# Patient Record
Sex: Male | Born: 1970 | Race: Black or African American | Hispanic: No | Smoking: Never smoker
Health system: Southern US, Community
[De-identification: ages and names within clinical notes are randomized; demographics above are authoritative.]

## PROBLEM LIST (undated history)

## (undated) DIAGNOSIS — I6529 Occlusion and stenosis of unspecified carotid artery: Secondary | ICD-10-CM

## (undated) DIAGNOSIS — I10 Essential (primary) hypertension: Secondary | ICD-10-CM

## (undated) DIAGNOSIS — E785 Hyperlipidemia, unspecified: Secondary | ICD-10-CM

## (undated) DIAGNOSIS — I669 Occlusion and stenosis of unspecified cerebral artery: Secondary | ICD-10-CM

## (undated) DIAGNOSIS — H409 Unspecified glaucoma: Secondary | ICD-10-CM

## (undated) HISTORY — DX: Occlusion and stenosis of unspecified carotid artery: I65.29

## (undated) HISTORY — PX: CARPAL TUNNEL RELEASE: SHX101

## (undated) HISTORY — PX: OTHER SURGICAL HISTORY: SHX169

---

## 2007-06-10 DIAGNOSIS — I669 Occlusion and stenosis of unspecified cerebral artery: Secondary | ICD-10-CM

## 2007-06-10 HISTORY — DX: Occlusion and stenosis of unspecified cerebral artery: I66.9

## 2007-09-08 ENCOUNTER — Inpatient Hospital Stay (HOSPITAL_COMMUNITY): Admission: EM | Admit: 2007-09-08 | Discharge: 2007-09-14 | Payer: Self-pay | Admitting: *Deleted

## 2007-09-08 ENCOUNTER — Encounter (INDEPENDENT_AMBULATORY_CARE_PROVIDER_SITE_OTHER): Payer: Self-pay | Admitting: *Deleted

## 2007-09-08 ENCOUNTER — Ambulatory Visit: Payer: Self-pay | Admitting: Cardiology

## 2007-09-08 ENCOUNTER — Encounter: Payer: Self-pay | Admitting: Emergency Medicine

## 2010-10-22 NOTE — Discharge Summary (Signed)
NAME:  [name], [name]               ACCOUNT NO.:  [id number]   MEDICAL RECORD NO.:  [id number]          PATIENT TYPE:  INP   LOCATION:  3032                         FACILITY:  [facility]   PHYSICIAN:  Pramod P. Pearlean Brownie, MD    DATE OF BIRTH:  1971-06-01   DATE OF ADMISSION:  09/08/2007  DATE OF DISCHARGE:  09/14/2007                               DISCHARGE SUMMARY   DIAGNOSIS:  At the time of discharge; Venous sinus thrombosis, unknown  etiology.   MEDICINES:  At the time of discharge, Coumadin 10 mg a day.   STUDIES PERFORMED:  1. CT of the brain on admission shows suspected acute thrombosis of      the right transverse sinus.  2. MRI of the brain, evidence of superior sagittal sinus, left      transverse, and sigmoid sinus thrombosis.  No acute infarct or      signal abnormality in the brain identified, inflammatory changes in      the frontal sinus, both mastoids, and some ethmoid air cells.  3. MRA of the brain shows extensive thrombosis of the superior      sagittal sinus extending to the torcula involving the left      transverse and left sigmoid sinus.  Right transverse sigmoid sinus,      internal jugular bulb, and visualized right internal jugular veins      are patent.  Internal cerebral vein is patent.  Normal MRA.  4. A 2D echocardiogram shows EF of 55-65% with no left ventricular      regional wall abnormalities.  No embolic source.  5. EKG shows sinus bradycardia with sinus arrhythmia.  6. Carotid Doppler.  No significant plaque visualized with 40-60% ICA      stenosis.   LABORATORY STUDIES:  CBC normal.  Chemistry with glucose 147, otherwise  normal.  Coagulation studies normal on the admission.  Day of discharge  INR of 1.9.  Liver function tests normal.  Cardiac enzymes negative.  Cholesterol 130, triglycerides 23, HDL 55, and LDL 70.  Urinalysis with  11-20 red blood cells, 0-2 white blood cells, rare bacteria, no  leukocyte esterase.  Urine drug screen negative.  ANA  pending.  Sed rate  pending.  Sickle cell screen negative.  Here; sed rate of 6 and  hemoglobin A1c of 5.6.  ANA negative.  Hypercoagulable panel with  antithrombin III 116.  Protein C normal.  Protein S high at 158.  Lupus  anticoagulant not detected, negative factor V mutation.  Homocystine  7.5.  Negative prothrombin II gene mutation.  Cardiolipin IgG negative.   HISTORY OF PRESENT ILLNESS:  [name] is a 40 year old African American  male who presents to the emergency department after awakening the  morning of admission with acute onset of severe headache.  He has no  past medical history.  The exact time of onset is unknown.  As per the  patient, his headache was first located in the back, frontal head  regions.  It was pounding and sharp in tendency without any radiation.  He had associated nausea and photophobia.  He said it has slightly  improved in the emergency room.  A CT of the head in the emergency room  showed a right transverse sinus thrombosis.  The patient denied any  focal weakness, numbness, vision changes, speech changes, or word  problems.  He was admitted to the hospital to the neuro ICU for further  evaluation.  MRI and MRV were ordered.   HOSPITAL COURSE:  While initial CT suggested a right transverse sinus  thrombosis, MRI actually confirmed a left transverse sinus thrombosis.  The patient was placed on IV heparin for stroke prevention and with  eventual transfer to Coumadin.  Plans are to do Coumadin for 6 months,  reevaluate and likely discontinue.  Of note, the etiology for venous  sinus thrombosis was not found.  There was no obvious source of  infection, though question some mastoiditis, but no other etiology was  even approached.  The patient throughout his hospitalization had a  negative neuro exam.  Once he was elevated on his Coumadin, he was safe  for discharge home.   CONDITION AT DISCHARGE:  The patient alert and oriented x3.  No speech   difficulties, no dysarthria, and no aphasia.  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  His gaze was normal.  He has no drift  or weakness in his upper extremities or lower extremities.  He has  normal sensation, and no ataxia.   DISCHARGE PLAN:  1. Discharged home with family.  2. Coumadin for stroke prevention, venous sinus thrombosis treatment.      Anticipate 6 months' duration of treatment, will need follow up      with Dr. Pearlean Brownie.  3. Follow up with Dr. Andi Devon Thursday, September 16, 2007, at      3:30 p.m.  4. Follow up with Dr. Delia Heady in his office in 2-3 months.      Annie Main, N.P.    ______________________________  Sunny Schlein. Pearlean Brownie, MD    SB/MEDQ  D:  09/14/2007  T:  09/15/2007  Job:  [id number]   cc:   Pramod P. Pearlean Brownie, MD  Merlene Laughter. Renae Gloss, M.D.

## 2010-10-22 NOTE — H&P (Signed)
NAME:  [name], [name] NO.:  [id number]   MEDICAL RECORD NO.:  [id number]          PATIENT TYPE:  INP   LOCATION:  3111                         FACILITY:  [facility]   PHYSICIAN:  Bevelyn Buckles. Champey, M.D.DATE OF BIRTH:  06/01/1971   DATE OF ADMISSION:  09/08/2007  DATE OF DISCHARGE:                              HISTORY & PHYSICAL   REASON FOR CONSULTATION:  Headache with venous thrombosis.   HISTORY:  Mr. Brunetto is a 40 year old African/American male otherwise  healthy, who presented to the emergency department after awakening this  morning, with the acute onset of a severe headache.  The exact time is  unknown.  As per the patient, his headache was located in the bifrontal  head regions.  It was pounding and sharp in tendency without any  radiation.  The patient had associated nausea and photophobia with his  headache.  He said it slightly improved in the emergency room.  A CT of  the head in the emergency room showed a right transverse sinus  thrombosis.  The patient denied any focal weakness, numbness, vision  changes, speech changes or word-finding problems.  No dizziness,  vertigo, falls or loss of consciousness.   PAST MEDICAL HISTORY:  Unremarkable.   MEDICATIONS:  None.   ALLERGIES:  No known drug allergies.   PAST MEDICAL HISTORY:  1. Positive for hypertension.  2. Diabetes.   SOCIAL HISTORY:  The patient lives alone.  Denies any smoking, alcohol  or drug use.   REVIEW OF SYSTEMS:  Positive as mentioned in the history of present  illness.  Negative in eight other organ systems.   PHYSICAL EXAMINATION:  VITAL SIGNS:  Temperature 97.4 degrees, blood  pressure 143/82, pulse 81, respirations 17, O2 saturation 99%.  HEENT:  Normocephalic and atraumatic.  Extraocular muscles intact.  Pupils equal, round, reactive to light.  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  NECK:  Supple, no carotid bruits.  HEART:  Regular.  LUNGS:   Clear.  ABDOMEN:  Soft, nontender.  EXTREMITIES:  Show good pulses.  NEUROLOGIC:  The patient is awake, alert.  The patient is following  commands appropriately.  Cranial nerves II-XII  grossly intact except  for the patient's inability to lift the right eyelid which is  congenital.  Motor examination:  The patient has good strength in all  four extremities.  Sensory examination is within normal limits.  Reflexes are 1-2+ and symmetric.  Cerebral function is within normal  limits, finger-to-nose, and gait not assessed, secondary to safety.   LABORATORY DATA:  WBC 6.7, hemoglobin 14.6, hematocrit 44, platelets  302.  PT 13.3, INR 1, PTT 24.  Sodium 138, potassium 3.1, chloride 102,  CO2 of 22, BUN 10, creatinine 1.36, glucose 154.   A CT of the head shows suspected right transverse sinus thrombosis.   IMPRESSION:  A 40 year old African/American male with a severe headache  and venous thrombosis.   PLAN:  The patient is admitted to the stroke MD service and transferred  over to [facility] to the Stroke Center.  I will start  him on IV heparin, stroke protocol.  Check a MRI/MRA and MRV and a  stroke workup, including hypercoagulable panel.  Given Dilaudid p.r.n.  for his headache and pain.  I will follow the patient.      Bevelyn Buckles. Nash Shearer, M.D.  Electronically Signed     DRC/MEDQ  D:  09/08/2007  T:  09/08/2007  Job:  [id number]

## 2010-10-25 NOTE — H&P (Signed)
NAME:  YOUSAF, [name] NO.:  [id number]   MEDICAL RECORD NO.:  [id number]          PATIENT TYPE:  INP   LOCATION:  3032                         FACILITY:  [facility]   PHYSICIAN:  Hettie Holstein, D.O.    DATE OF BIRTH:  1971-05-23   DATE OF ADMISSION:  09/08/2007  DATE OF DISCHARGE:  09/14/2007                              HISTORY & PHYSICAL   PRIMARY CARE PHYSICIAN:  The patient claims that the [facility] here is his primary physician.  He is to see Dr. Sampson Goon.   CHIEF COMPLAINT:  Abdominal pain.   HISTORY OF PRESENT ILLNESS:  [name] is a pleasant 40 year old male  who had a recent hospitalization as well as a rehab course where he was  released on July 28, 2007, and done fairly well since that time,  though he has a known diagnosis of chronic pancreatitis.  He has  presented on a couple of occasions where he has received IV fluids, and  he states that typically he does much better and is able to go home.  He  states this pain, however, has persisted despite IV fluids, that has  been associated with nausea and vomiting.  He has a mild sensation of  globus.  He is unable to tolerate oral intake.  Despite this, he is  quite thirsty and hungry.  In any event in the emergency department, he  underwent CT evaluation that revealed a mildly dilated esophagus  suggestive of dysmotility.  He cannot recall gastroenterology evaluation  in the past in any event.   PAST MEDICAL HISTORY:  His past medical history is extensive and  includes:  1. HIV under current treatment by Dr. Sampson Goon at the [facility] currently on [place] and [place].  Additionally,      he has insulin-requiring diabetes.  He states that he is      occasionally compliant.  HIV was diagnosed in 2001, last CD4 count      on the computer was the end of last year of 490.  2. History of cirrhosis.  3. 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  4. History of stent in his right superficial femoral artery.  5. Removal of scrotal cyst in the past status post cholecystectomy.  6. History of hepatitis B in the past.   MEDICATIONS:  He claims to be on are as follows:  1. Tramadol every 6 hours 50 mg as needed.  2. Clonidine 0.3 mg p.o. b.i.d.  3. Creon 2 caps t.i.d.  4. Norvasc 10 mg daily.  5. Lyrica 50 mg b.i.d.  6. Percocet every 4-6 hours as needed.  7. Senokot 2 tablets daily.  8. Feratab 300 mg twice daily with meals.  9. Plavix 75 mg daily.  10.OxyContin 20 mg as needed.  11.Ativan 0.5 mg t.i.d. p.r.n.   ALLERGIES:  Phenergan.   SOCIAL HISTORY:  The patient denies history of smoke or tobacco.  There  are some reports of cocaine  and marijuana occasionally.  He reports  being bisexual.   FAMILY HISTORY:  His mother is healthy.  Father passed away after being  stabbed.   REVIEW OF SYSTEMS:  He states that since returning from rehab, he has  had occasional bouts of pancreatitis with nausea and vomiting, and he is  familiar with these symptoms.  He states this sounds similar, but not  exactly in that his pain is persisting despite IV fluids in this time.  He has had no bloody diarrhea.  No bloody emesis.  He reports mild  globus sensation.  He had some midsternal pain with oral intake at  times.  Further review is unremarkable.   PHYSICAL EXAMINATION:  VITAL SIGNS:  In the emergency department, he was  tachycardic.  His blood pressure was elevated at 223/111 and his heart  rate was 124.  Oxygen saturation 99% on room air.  HEENT:  Normocephalic, atraumatic.  Extraocular muscles are intact.  NECK:  Supple.  Nontender.  No palpable thyromegaly or mass.  CARDIOVASCULAR:  Reveals normal S1, S2 with a left sternal border  murmur.  His heart was tachycardic.  LUNGS:  Clear.  There was no dullness to percussion.  There is normal  effort.  ABDOMEN:  Diffusely tender with  most prominent in mid epigastric to deep  palpation.  Bowel sounds are normoactive.  EXTREMITIES:  Lower extremities revealed no edema.  He does have below-  the-knee amputation on the right.  NEUROLOGICAL:  Mr. Brisco is alert and oriented x4.  No focal neurologic  deficit.   LABORATORY DATA:  His lipase was high normal at 59.  Urinalysis revealed  21-50 rbc's.  Sodium was 141, potassium 4.5, BUN 12, creatinine 1.61,  and glucose 72.  AST/ALT 31/33, and hemoglobin A1c of 8.1.  CT revealed  mildly dilated contrast-filled esophagus suggesting dysmotility.  Cirrhosis was once again noted, and there was an increased gastrohepatic  lymphadenopathy and questionable pancreatitis.   ASSESSMENT:  1. Abdominal pain, nausea, vomiting, probably acute on chronic      pancreatitis.  2. Presbyesophagus.  A gastrointestinal evaluation may be warranted or      a barium swallow.  3. Poorly controlled diabetes mellitus.  4. Hypertensive urgency.  5. Cirrhosis.  6. Enlarged lymph nodes/gastrohepatic.  7. Human immunodeficiency virus, under active treatment.   PLAN:  At this time, Mr. Jurewicz will be admitted to telemetry floor.  He  will be placed on an adequate pain control for his pancreatitis,  generous IV fluids, and bowel and GI rest.  We will consider evaluation  with respect to his esophagus at some point especially once his symptoms  have stabilized.      Hettie Holstein, D.O.  Electronically Signed     ESS/MEDQ  D:  09/19/2007  T:  09/19/2007  Job:  [id number]   cc:   Mick Sell, MD

## 2011-03-04 LAB — CBC
HCT: 37.4 — ABNORMAL LOW
HCT: 37.5 — ABNORMAL LOW
HCT: 37.6 — ABNORMAL LOW
HCT: 39.7
HCT: 44
Hemoglobin: 12.6 — ABNORMAL LOW
Hemoglobin: 12.7 — ABNORMAL LOW
Hemoglobin: 12.8 — ABNORMAL LOW
Hemoglobin: 13.1
Hemoglobin: 14.6
MCHC: 33.1
MCHC: 33.3
MCHC: 33.8
MCHC: 34.1
MCV: 92.3
MCV: 93.5
Platelets: 225
Platelets: 230
Platelets: 233
Platelets: 237
RBC: 3.9 — ABNORMAL LOW
RBC: 4.02 — ABNORMAL LOW
RBC: 4.77
RDW: 12.5
RDW: 12.6
RDW: 12.8
RDW: 12.8
RDW: 13
WBC: 6.7
WBC: 8.8

## 2011-03-04 LAB — COMPREHENSIVE METABOLIC PANEL
ALT: 10
AST: 21
Albumin: 4.1
Alkaline Phosphatase: 52
BUN: 7
Chloride: 104
GFR calc Af Amer: >60
Potassium: 4.4
Sodium: 137
Total Bilirubin: 0.7
Total Protein: 7

## 2011-03-04 LAB — URINALYSIS, ROUTINE W REFLEX MICROSCOPIC
Bilirubin Urine: NEGATIVE
Bilirubin Urine: NEGATIVE
Glucose, UA: NEGATIVE
Ketones, ur: NEGATIVE
Leukocytes, UA: NEGATIVE
Nitrite: NEGATIVE
Specific Gravity, Urine: 1.008
Specific Gravity, Urine: 1.009
Urobilinogen, UA: 1
pH: 6
pH: 7

## 2011-03-04 LAB — PROTIME-INR
INR: 1
INR: 1
INR: 1.1
INR: 1.2
INR: 1.6 — ABNORMAL HIGH
Prothrombin Time: 14.2
Prothrombin Time: 15.9 — ABNORMAL HIGH

## 2011-03-04 LAB — LUPUS ANTICOAGULANT PANEL
DRVVT: 42.5 (ref 36.1–47.0)
PTT Lupus Anticoagulant: 58.7 — ABNORMAL HIGH (ref 36.3–48.8)
PTTLA 4:1 Mix: 47.3 (ref 36.3–48.8)

## 2011-03-04 LAB — BETA-2-GLYCOPROTEIN I ABS, IGG/M/A: Beta-2-Glycoprotein I IgA: 5 U/mL (ref <10)

## 2011-03-04 LAB — DIFFERENTIAL
Eosinophils Absolute: 0.1
Lymphs Abs: 4.4 — ABNORMAL HIGH
Monocytes Absolute: 0.6
Monocytes Relative: 10
Neutrophils Relative %: 22 — ABNORMAL LOW

## 2011-03-04 LAB — DRUGS OF ABUSE SCREEN W/O ALC, ROUTINE URINE
Benzodiazepines.: NEGATIVE
Cocaine Metabolites: NEGATIVE
Methadone: NEGATIVE
Opiate Screen, Urine: NEGATIVE
Phencyclidine (PCP): NEGATIVE
Propoxyphene: NEGATIVE

## 2011-03-04 LAB — CARDIAC PANEL(CRET KIN+CKTOT+MB+TROPI)
CK, MB: 1.2
Relative Index: 0.5

## 2011-03-04 LAB — LIPID PANEL
Cholesterol: 130
HDL: 55
Triglycerides: 23

## 2011-03-04 LAB — URINE MICROSCOPIC-ADD ON

## 2011-03-04 LAB — BASIC METABOLIC PANEL
CO2: 22
Chloride: 102
Creatinine, Ser: 1.36
GFR calc Af Amer: >60
Potassium: 3.1 — ABNORMAL LOW
Sodium: 138

## 2011-03-04 LAB — PROTEIN C ACTIVITY: Protein C Activity: 133 % (ref 75–133)

## 2011-03-04 LAB — ANTITHROMBIN III: AntiThromb III Func: 116 (ref 76–126)

## 2011-03-04 LAB — SICKLE CELL SCREEN: Sickle Cell Screen: NEGATIVE

## 2011-03-04 LAB — FACTOR 5 LEIDEN

## 2011-03-04 LAB — CARDIOLIPIN ANTIBODIES, IGG, IGM, IGA
Anticardiolipin IgA: <7 — ABNORMAL LOW (ref <13)
Anticardiolipin IgG: <7 — ABNORMAL LOW (ref <11)

## 2011-03-04 LAB — PROTHROMBIN GENE MUTATION

## 2011-03-04 LAB — ANA: Anti Nuclear Antibody(ANA): NEGATIVE

## 2011-03-04 LAB — PROTEIN C, TOTAL: Protein C, Total: 85 % (ref 70–140)

## 2011-03-04 LAB — HOMOCYSTEINE: Homocysteine: 7.9

## 2011-03-04 LAB — HEPARIN LEVEL (UNFRACTIONATED): Heparin Unfractionated: 0.39

## 2011-07-31 ENCOUNTER — Other Ambulatory Visit: Payer: Self-pay | Admitting: Family Medicine

## 2011-07-31 DIAGNOSIS — M542 Cervicalgia: Secondary | ICD-10-CM

## 2011-08-05 ENCOUNTER — Ambulatory Visit
Admission: RE | Admit: 2011-08-05 | Discharge: 2011-08-05 | Disposition: A | Discharge status: Home / Self Care | Payer: Managed Care, Other (non HMO) | Source: Ambulatory Visit | Attending: Family Medicine | Admitting: Family Medicine

## 2011-08-05 DIAGNOSIS — M542 Cervicalgia: Secondary | ICD-10-CM

## 2011-08-07 ENCOUNTER — Emergency Department (HOSPITAL_BASED_OUTPATIENT_CLINIC_OR_DEPARTMENT_OTHER)
Admission: EM | Admit: 2011-08-07 | Discharge: 2011-08-07 | Disposition: A | Discharge status: Home / Self Care | Payer: Managed Care, Other (non HMO) | Attending: Emergency Medicine | Admitting: Emergency Medicine

## 2011-08-07 ENCOUNTER — Encounter (HOSPITAL_BASED_OUTPATIENT_CLINIC_OR_DEPARTMENT_OTHER): Payer: Self-pay | Admitting: *Deleted

## 2011-08-07 DIAGNOSIS — M79609 Pain in unspecified limb: Secondary | ICD-10-CM | POA: Insufficient documentation

## 2011-08-07 DIAGNOSIS — R209 Unspecified disturbances of skin sensation: Secondary | ICD-10-CM | POA: Insufficient documentation

## 2011-08-07 DIAGNOSIS — M4802 Spinal stenosis, cervical region: Secondary | ICD-10-CM | POA: Insufficient documentation

## 2011-08-07 DIAGNOSIS — M5412 Radiculopathy, cervical region: Secondary | ICD-10-CM | POA: Insufficient documentation

## 2011-08-07 HISTORY — DX: Occlusion and stenosis of unspecified cerebral artery: I66.9

## 2011-08-07 NOTE — Discharge Instructions (Signed)
Cervical Radiculopathy Cervical radiculopathy is a pinched nerve in the neck. When this happens you may have pain or numbness shooting from your neck all the way down into your arm and fingers. There are many causes of this problem. Sometimes this may happen from an injury or simply from muscle tightness in the neck from overuse. It may also happen from arthritis or bone problems. If there is no improvement after treatment, further studies may be done to find the exact cause. DIAGNOSIS  X-rays may be needed if the problems become long standing. Electromyograms may be done. This study is one in which the working of nerves and muscles is studied. HOME CARE INSTRUCTIONS   Applications of ice packs may be helpful. Ice can be used in a plastic bag with a towel around it to prevent frostbite to skin. This may be used every 2 hours for 20 to 30 minutes, or as needed, while awake or as directed by your caregiver.   Sleep at night with a flat pillow.   Only take over-the-counter or prescription medicines for pain, discomfort, or fever as directed by your caregiver.   If physical therapy was prescribed, follow your caregiver's directions. If a cervical collar or cervical traction device was prescribed, use them as directed.  SEEK IMMEDIATE MEDICAL CARE IF:   You have pain not controlled with medications.   You seem to be getting worse rather than better.   You develop weakness in your hand or arm.   You develop new numbness or loss of feeling in your arms or legs.   You develop loss of bowel or bladder control.   You have difficulty with walking or balance, or develop clumsiness in the use of your arms or legs.  MAKE SURE YOU:   Understand these instructions.   Will watch your condition.   Will get help right away if you are not doing well or get worse.  Document Released: 02/18/2001 Document Revised: 12/09/2010 Document Reviewed: 09/29/2007 [employer] Patient Information 2012 [employer]. 

## 2011-08-07 NOTE — ED Notes (Signed)
Patient states he has had numbness and tingling in right hand tingling for the last 3 weeks.  Was seen by his PCP, Dr. Kellie Shropshire and was started on steroids and antiinflammatories.  MRI done at [facility] 2 days ago.  Stated his symptoms have continued and he is concerned.

## 2011-08-07 NOTE — ED Provider Notes (Signed)
I saw and evaluated the patient, reviewed the resident's note and I agree with the findings and plan. The patient with MRI on Tuesday that showed cervical radiculopathy. No spinal cord compromise. Patient just has persistent symptoms. There is no weakness of the hand on exam. He is already taking steroids and will continue his current regime and then start ibuprofen. Patient was given referral to neurosurgery for further evaluation.  Gwyneth Sprout, MD 08/07/11 1433

## 2011-08-07 NOTE — ED Provider Notes (Signed)
History     CSN: [id number]  Arrival date & time 08/07/11  1222   First MD Initiated Contact with Patient 08/07/11 1235      Chief Complaint  Patient presents with  . Extremity Pain    right hand numbness and tingling   HPI patient is a 41 year old male who began experiencing problems with right hand numbness and tingling approximately 2 months ago. He went to his primary care provider, Dr. Kellie Shropshire, who started him on steroids anti-inflammatories. 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 He has not yet heard the results of this and is somewhat worried about continued symptoms. He continues to have intermittent right hand numbness and tingling that is primarily brought on by extension of his neck. He does not have any weakness. He does not have any pain anywhere. He does not have any left arm complaints. He does not have any neck pain. He has no numbness elsewhere. He states the prednisone has not helped his symptoms.  Past Medical History  Diagnosis Date  . Blood clots in brain     History reviewed. No pertinent past surgical history.  No family history on file.  History  Substance Use Topics  . Smoking status: Never Smoker   . Smokeless tobacco: Not on file  . Alcohol Use: No      Review of Systems  Constitutional: Negative.   HENT: Negative.   Eyes: Negative.   Respiratory: Negative.   Cardiovascular: Negative.   Gastrointestinal: Negative.   Genitourinary: Negative.   Musculoskeletal: Negative.   Skin: Negative.   Neurological: Positive for numbness. Negative for dizziness, weakness, light-headedness and headaches.  Hematological: Negative.     Allergies  Review of patient's allergies indicates no known allergies.  Home Medications   Current Outpatient Rx  Name Route Sig Dispense Refill  . ASPIRIN 81 MG PO TABS Oral Take 81 mg by mouth daily.    Marland Kitchen PREDNISONE (PAK) 10 MG PO TABS Oral Take 10  mg by mouth daily.      BP 143/80  Pulse 73  Temp 98.6 F (37 C)  Resp 20  Ht 5\' 9"  (1.753 m)  Wt 217 lb (98.431 kg)  BMI 32.05 kg/m2  SpO2 100%  Physical Exam  Constitutional: He is oriented to person, place, and time. He appears well-developed and well-nourished. No distress.  HENT:  Head: Normocephalic and atraumatic.  Right Ear: External ear normal.  Left Ear: External ear normal.  Eyes: Conjunctivae and EOM are normal.  Neck: Normal range of motion.  Cardiovascular: Normal rate, regular rhythm and normal heart sounds.   Pulmonary/Chest: Effort normal and breath sounds normal.  Abdominal: Soft.  Musculoskeletal: Normal range of motion. He exhibits no edema and no tenderness.  Neurological: He is alert and oriented to person, place, and time. He has normal reflexes. No cranial nerve deficit.  Skin: Skin is warm and dry.  Psychiatric: He has a normal mood and affect.    ED Course  Procedures (including critical care time)  Labs Reviewed - No data to display Mr Cervical Spine Wo Contrast  08/06/2011  *RADIOLOGY REPORT*  Clinical Data: Right-sided medial arm numbness and tingling.  MRI CERVICAL SPINE WITHOUT CONTRAST  Technique:  Multiplanar and multiecho pulse sequences of the cervical spine, to include the craniocervical junction and cervicothoracic junction, were obtained according to standard protocol without intravenous contrast.  Comparison: None.  Findings: Anatomic alignment the  cervical spine.  Cervical cord appears within normal limits.  Grossly, posterior fossa structures are normal.  There are flow voids present in both vertebral arteries.  Marrow signal is within normal limits. Study is mildly degraded by motion artifact.  Several repeat sequences were performed with little improvement.  C2-C3:  Negative.  C3-C4:  Negative.  C4-C5: Right paracentral shallow disc protrusion is present with mild central stenosis.  Protrusion just contacts the ventral aspect of the  cervical cord.  Foramina patent.  C5-C6: Negative.  C6-C7 Disc desiccation and degeneration.  Shallow broad-based right eccentric protrusion is present.  There is mild right foraminal stenosis associated with soft disc protrusion into the foramen. Mild central stenosis.  Left neural foramen appears patent.  C7-T1:  Negative.  IMPRESSION: 1.  C5-C6 right paracentral disc protrusion that extends into the right neural foramen producing right foraminal encroachment that potentially affects the right C6 nerve.  Mild central stenosis. 2.  C4-C5 right paracentral shallow disc protrusion with mild central stenosis.  Protrusion may just contact the ventral aspect of the cervical cord. 3.  Mild motion degradation of the study.  Original Report Authenticated By: Andreas Newport, M.D.     No diagnosis found.    MDM  Pt with cervical radiculopathy, confirmed by MRI.  Will continue prednisone, encourage NSAID use once course is complete and f/u with PCP.        Majel Homer, MD 08/07/11 1341

## 2011-12-31 ENCOUNTER — Encounter (HOSPITAL_BASED_OUTPATIENT_CLINIC_OR_DEPARTMENT_OTHER): Payer: Self-pay | Admitting: *Deleted

## 2011-12-31 ENCOUNTER — Emergency Department (HOSPITAL_BASED_OUTPATIENT_CLINIC_OR_DEPARTMENT_OTHER)
Admission: EM | Admit: 2011-12-31 | Discharge: 2011-12-31 | Disposition: A | Discharge status: Home / Self Care | Payer: Managed Care, Other (non HMO) | Attending: Emergency Medicine | Admitting: Emergency Medicine

## 2011-12-31 DIAGNOSIS — Z86718 Personal history of other venous thrombosis and embolism: Secondary | ICD-10-CM | POA: Insufficient documentation

## 2011-12-31 DIAGNOSIS — S6990XA Unspecified injury of unspecified wrist, hand and finger(s), initial encounter: Secondary | ICD-10-CM | POA: Insufficient documentation

## 2011-12-31 DIAGNOSIS — S6980XA Other specified injuries of unspecified wrist, hand and finger(s), initial encounter: Secondary | ICD-10-CM | POA: Insufficient documentation

## 2011-12-31 DIAGNOSIS — X58XXXA Exposure to other specified factors, initial encounter: Secondary | ICD-10-CM | POA: Insufficient documentation

## 2011-12-31 DIAGNOSIS — IMO0001 Reserved for inherently not codable concepts without codable children: Secondary | ICD-10-CM

## 2011-12-31 NOTE — ED Notes (Signed)
Pt c/o right ring finger injury x 4 days ago

## 2011-12-31 NOTE — ED Provider Notes (Signed)
History     CSN: [id number]  Arrival date & time 12/31/11  1826   First MD Initiated Contact with Patient 12/31/11 1838      Chief Complaint  Patient presents with  . 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 It occurred on Saturday when he was lifting a speaker. At that time he felt a pop and stated that it became sore thereafter. He denies any swelling discoloration, or tenderness. He says that he has not had any problems using the finger since the injury, but just want to make sure there is nothing serious occurred. He states he has normal sensation and grip strength.  Past Medical History  Diagnosis Date  . Blood clots in brain     History reviewed. No pertinent past surgical history.  History reviewed. No pertinent family history.  History  Substance Use Topics  . Smoking status: Never Smoker   . Smokeless tobacco: Not on file  . Alcohol Use: No      Review of Systems  All other systems reviewed and are negative.    Allergies  Review of patient's allergies indicates no known allergies.  Home Medications   Current Outpatient Rx  Name Route Sig Dispense Refill  . ASPIRIN EC 81 MG PO TBEC Oral Take 81 mg by mouth daily.      BP 158/85  Pulse 78  Temp 98 F (36.7 C)  Resp 16  Ht 5\' 9"  (1.753 m)  Wt 206 lb (93.441 kg)  BMI 30.42 kg/m2  SpO2 100%  Physical Exam  Constitutional: He appears well-developed and well-nourished. No distress.  Musculoskeletal:       Right hand: Normal. He exhibits no tenderness, no bony tenderness, no deformity and no swelling. Normal strength noted.       Normal PIP and DIPs flexion of the fourth digit of right hand; sensation intact to all digits of the right hand  Skin: He is not diaphoretic.    ED Course  Procedures (including critical care time)  Labs  Reviewed - No data to display No results found.   1. Injury of fourth finger, right       MDM  Given the patient's history and normal physical exam, I concluded there was no tendon injury requiring further imaging or evaluation at this time. Additionally there is no evidence of any bony injury that needed imaging or splinting. Patient is stable and appropriate for discharge. He is in agreement with this plan.        Garnetta Buddy, MD 12/31/11 [phone]

## 2011-12-31 NOTE — ED Provider Notes (Signed)
I saw and evaluated the patient, reviewed the resident's note and I agree with the findings and plan.   .Face to face Exam:  General:  Awake HEENT:  Atraumatic Resp:  Normal effort Abd:  Nondistended Neuro:No focal weakness Lymph: No adenopathy   Arnie Maiolo L Jayce Kainz, MD 12/31/11 2116 

## 2012-03-04 ENCOUNTER — Emergency Department (HOSPITAL_BASED_OUTPATIENT_CLINIC_OR_DEPARTMENT_OTHER): Payer: Managed Care, Other (non HMO)

## 2012-03-04 ENCOUNTER — Encounter (HOSPITAL_BASED_OUTPATIENT_CLINIC_OR_DEPARTMENT_OTHER): Payer: Self-pay

## 2012-03-04 ENCOUNTER — Emergency Department (HOSPITAL_BASED_OUTPATIENT_CLINIC_OR_DEPARTMENT_OTHER)
Admission: EM | Admit: 2012-03-04 | Discharge: 2012-03-04 | Disposition: A | Discharge status: Home / Self Care | Payer: Managed Care, Other (non HMO) | Attending: Emergency Medicine | Admitting: Emergency Medicine

## 2012-03-04 DIAGNOSIS — Z7982 Long term (current) use of aspirin: Secondary | ICD-10-CM | POA: Insufficient documentation

## 2012-03-04 DIAGNOSIS — R0789 Other chest pain: Secondary | ICD-10-CM

## 2012-03-04 DIAGNOSIS — F419 Anxiety disorder, unspecified: Secondary | ICD-10-CM

## 2012-03-04 DIAGNOSIS — F411 Generalized anxiety disorder: Secondary | ICD-10-CM | POA: Insufficient documentation

## 2012-03-04 LAB — CBC
HCT: 38.7 % — ABNORMAL LOW (ref 39.0–52.0)
MCHC: 35.4 g/dL (ref 30.0–36.0)
Platelets: 254 10*3/uL (ref 150–400)
RDW: 11.7 % (ref 11.5–15.5)
WBC: 4.5 10*3/uL (ref 4.0–10.5)

## 2012-03-04 LAB — TROPONIN I: Troponin I: <0.3 ng/mL (ref <0.30)

## 2012-03-04 LAB — BASIC METABOLIC PANEL
BUN: 13 mg/dL (ref 6–23)
Chloride: 101 mEq/L (ref 96–112)
GFR calc Af Amer: 85 mL/min — ABNORMAL LOW (ref >90)
GFR calc non Af Amer: 73 mL/min — ABNORMAL LOW (ref >90)
Potassium: 4 mEq/L (ref 3.5–5.1)

## 2012-03-04 MED ORDER — ASPIRIN 81 MG PO CHEW
CHEWABLE_TABLET | ORAL | Status: AC
  Administered 2012-03-04: 324 mg via ORAL
  Filled 2012-03-04: qty 4

## 2012-03-04 MED ORDER — ASPIRIN EC 325 MG PO TBEC
325.0000 mg | DELAYED_RELEASE_TABLET | Freq: Once | ORAL | Status: DC
  Filled 2012-03-04: qty 1

## 2012-03-04 MED ORDER — ASPIRIN 81 MG PO CHEW
324.0000 mg | CHEWABLE_TABLET | Freq: Once | ORAL | Status: AC
  Administered 2012-03-04: 324 mg via ORAL

## 2012-03-04 NOTE — ED Provider Notes (Signed)
History     CSN: [id number]  Arrival date & time 03/04/12  1117   First MD Initiated Contact with Patient 03/04/12 1138      Chief Complaint  Patient presents with  . Chest Pain    (Consider location/radiation/quality/duration/timing/severity/associated sxs/prior treatment) HPI Gareld Blaes is a 41 y.o. male history of mild anxiety, no history of coronary artery disease or family history of sudden or early cardiac death due to MI or arrhythmia presents with chest pain that he describes as a 2-3/10, a dull or "muscle ache", located in the center to left of center of his chest, not associated shortness of breath, nausea vomiting or diaphoresis, this lasted 5-10 seconds while at work today. Pain is currently not present. Patient says he's recently started working out again. He said no problems with working out, no chest pains while at the gym. Patient has not taken anything for it.   Past Medical History  Diagnosis Date  . Blood clots in brain     History reviewed. No pertinent past surgical history.  No family history on file.  History  Substance Use Topics  . Smoking status: Never Smoker   . Smokeless tobacco: Not on file  . Alcohol Use: No      Review of Systems At least 10pt or greater review of systems completed and are negative except where specified in the HPI.  Allergies  Review of patient's allergies indicates no known allergies.  Home Medications   Current Outpatient Rx  Name Route Sig Dispense Refill  . ASPIRIN EC 81 MG PO TBEC Oral Take 81 mg by mouth daily.      BP 139/78  Pulse 52  Temp 98.4 F (36.9 C) (Oral)  Resp 12  Ht 5\' 9"  (1.753 m)  Wt 216 lb (97.977 kg)  BMI 31.90 kg/m2  SpO2 100%  Physical Exam  Nursing notes reviewed.  Electronic medical record reviewed. VITAL SIGNS:   Filed Vitals:   03/04/12 1126 03/04/12 1320  BP: 139/78 125/63  Pulse: 52 71  Temp: 98.4 F (36.9 C)   TempSrc: Oral   Resp: 12   Height: 5\' 9"  (1.753 m)     Weight: 216 lb (97.977 kg)   SpO2: 100% 100%   CONSTITUTIONAL: Awake, oriented, appears non-toxic HENT: Atraumatic, normocephalic, oral mucosa pink and moist, airway patent. Nares patent without drainage. External ears normal. EYES: Conjunctiva clear, EOMI, PERRLA NECK: Trachea midline, non-tender, supple CARDIOVASCULAR: Normal heart rate, Normal rhythm, No murmurs, rubs, gallops PULMONARY/CHEST: Clear to auscultation, no rhonchi, wheezes, or rales. Symmetrical breath sounds. Non-tender. ABDOMINAL: Non-distended, soft, non-tender - no rebound or guarding.  BS normal. NEUROLOGIC: Non-focal, moving all four extremities, no gross sensory or motor deficits. EXTREMITIES: No clubbing, cyanosis, or edema SKIN: Warm, Dry, No erythema, No rash  ED Course  Procedures (including critical care time)  Date: 03/04/2012  Rate: 50  Rhythm:  sinus bradycardia   QRS Axis: normal  Intervals: normal  ST/T Wave abnormalities: normal  Conduction Disutrbances: none  Narrative Interpretation: unremarkable - nonischemic, no arrhythmia. When compared with prior EKG dated 09/08/2007, EKGs are similar with no acute changes, patient was bradycardic at that point with a heart rate of 59 beats per minute.  Labs Reviewed  CBC - Abnormal; Notable for the following:    HCT 38.7 (*)     All other components within normal limits  BASIC METABOLIC PANEL - Abnormal; Notable for the following:    GFR calc non Af Amer 73 (*)  GFR calc Af Amer 85 (*)     All other components within normal limits  TROPONIN I   Dg Chest 2 View  03/04/2012  *RADIOLOGY REPORT*  Clinical Data: Anterior chest pain.  CHEST - 2 VIEW  Comparison:  None  Findings:  The heart size and mediastinal contours are within normal limits.  Both lungs are clear.  No evidence of pneumothorax or pleural effusion.  The visualized skeletal structures are unremarkable.  IMPRESSION: No active cardiopulmonary disease.   Original Report Authenticated By: Danae Orleans, M.D.    1. Atypical chest pain   2. Anxiety    MDM  Bosco Paparella is a 41 y.o. male with no pertinent past medical history besides anxiety, no coronary disease history, no family history concerning for coronary artery disease percent with atypical chest pain it lasted 5-10 seconds. Based on the patient's physical exam and history, I think the patient is extremely low risk for ACS. Patient is also PERC negative - my suspicion is also very low for pulmonary embolism. Patient is completely nontoxic, in no distress, is afebrile with normal vital signs. Pulse rate is 52, however is otherwise asymptomatic, is alert and oriented x4 and of prior EKG and history of his vital signs have shown sinus bradycardia in the past.  Labs are within normal limits, EKG is nonacute, nonischemic.  I do not think this patient has pericarditis, pneumothorax, pneumonia, myocarditis, or a ruptured esophagus.  Patient discharged home in good condition, he can followup with his primary care physician as needed.         Jones Skene, MD 03/04/12 1425

## 2012-03-04 NOTE — ED Notes (Signed)
C/o central CP that started while at work-pain lasted "for seconds"-denies other s/s-denies pain at present

## 2013-07-05 ENCOUNTER — Emergency Department (HOSPITAL_BASED_OUTPATIENT_CLINIC_OR_DEPARTMENT_OTHER)
Admission: EM | Admit: 2013-07-05 | Discharge: 2013-07-05 | Disposition: A | Discharge status: Home / Self Care | Payer: Managed Care, Other (non HMO) | Attending: Emergency Medicine | Admitting: Emergency Medicine

## 2013-07-05 ENCOUNTER — Encounter (HOSPITAL_BASED_OUTPATIENT_CLINIC_OR_DEPARTMENT_OTHER): Payer: Self-pay | Admitting: Emergency Medicine

## 2013-07-05 DIAGNOSIS — Z86718 Personal history of other venous thrombosis and embolism: Secondary | ICD-10-CM | POA: Insufficient documentation

## 2013-07-05 DIAGNOSIS — Z7982 Long term (current) use of aspirin: Secondary | ICD-10-CM | POA: Insufficient documentation

## 2013-07-05 DIAGNOSIS — R6883 Chills (without fever): Secondary | ICD-10-CM | POA: Insufficient documentation

## 2013-07-05 DIAGNOSIS — K5289 Other specified noninfective gastroenteritis and colitis: Secondary | ICD-10-CM | POA: Insufficient documentation

## 2013-07-05 DIAGNOSIS — K529 Noninfective gastroenteritis and colitis, unspecified: Secondary | ICD-10-CM

## 2013-07-05 MED ORDER — ONDANSETRON 8 MG PO TBDP
8.0000 mg | ORAL_TABLET | Freq: Once | ORAL | Status: AC
  Administered 2013-07-05: 8 mg via ORAL
  Filled 2013-07-05: qty 1

## 2013-07-05 MED ORDER — ONDANSETRON 8 MG PO TBDP: 8.0000 mg | ORAL_TABLET | Freq: Three times a day (TID) | ORAL | Status: DC | PRN

## 2013-07-05 NOTE — ED Provider Notes (Addendum)
CSN: [id number]     Arrival date & time 07/05/13  [id number] History   First MD Initiated Contact with Patient 07/05/13 [phone]     Chief Complaint  Patient presents with  . Nausea  . Diarrhea   (Consider location/radiation/quality/duration/timing/severity/associated sxs/prior Treatment) Patient is a 43 y.o. male presenting with diarrhea. The history is provided by the patient. No language interpreter was used.  Diarrhea Quality:  Watery Severity:  Moderate Onset quality:  Sudden Number of episodes:  4 Duration:  12 hours Timing:  Intermittent Progression:  Improving Relieved by:  Nothing Worsened by:  Nothing tried Ineffective treatments:  None tried Associated symptoms: chills   Associated symptoms: no abdominal pain, no recent cough, no diaphoresis, no fever, no headaches, no myalgias, no URI and no vomiting   Associated symptoms comment:  Nausea Risk factors: no recent antibiotic use, no sick contacts and no travel to endemic areas     Past Medical History  Diagnosis Date  . Blood clots in brain    Past Surgical History  Procedure Laterality Date  . Carpel tunnel     No family history on file. History  Substance Use Topics  . Smoking status: Never Smoker   . Smokeless tobacco: Not on file  . Alcohol Use: No    Review of Systems  Constitutional: Positive for chills. Negative for fever and diaphoresis.  Gastrointestinal: Positive for diarrhea. Negative for vomiting and abdominal pain.  Musculoskeletal: Negative for myalgias.  Neurological: Negative for headaches.  All other systems reviewed and are negative.    Allergies  Review of patient's allergies indicates no known allergies.  Home Medications   Current Outpatient Rx  Name  Route  Sig  Dispense  Refill  . aspirin EC 81 MG tablet   Oral   Take 81 mg by mouth daily.          BP 151/86  Pulse 102  Temp(Src) 99.5 F (37.5 C) (Oral)  Resp 20  Ht 5\' 9"  (1.753 m)  Wt 212 lb (96.163 kg)  BMI 31.29 kg/m2   SpO2 100% Physical Exam  Nursing note and vitals reviewed. Constitutional: He is oriented to person, place, and time. He appears well-developed and well-nourished.  HENT:  Head: Normocephalic and atraumatic.  Right Ear: External ear normal.  Left Ear: External ear normal.  Nose: Nose normal.  Mouth/Throat: Oropharynx is clear and moist.  Eyes: Conjunctivae and EOM are normal. Pupils are equal, round, and reactive to light.  Neck: Normal range of motion. Neck supple.  Cardiovascular: Normal rate, regular rhythm, normal heart sounds and intact distal pulses.   Pulmonary/Chest: Effort normal and breath sounds normal.  Abdominal: Soft. Bowel sounds are normal.  Musculoskeletal: Normal range of motion.  Neurological: He is alert and oriented to person, place, and time. He has normal reflexes.  Skin: Skin is warm and dry.  Psychiatric: He has a normal mood and affect. His behavior is normal. Judgment and thought content normal.    ED Course  Procedures (including critical care time) Labs Review Labs Reviewed - No data to display Imaging Review No results found.  EKG Interpretation   None       MDM  No diagnosis found. Patient with nontender abdomen chills and diarrhea.  I have discussed return precautions such as abdominal pain, weakness, or intractable vomiting.  Plan zofran, clear liquids, and follow up if not better 24 hours.  Patient voices understanding.    Hilario Quarryanielle S Billyjack Trompeter, MD 07/05/13 [phone]  Patient's records  indicate he had hiv during viisit in 2009.  However, age does not match and patient denies this.  We are attempting to verify if correct records are tied to his name and mrn.  Patient denies that he has ever had or been treated for hiv.   Hilario Quarry, MD 07/06/13 1357

## 2013-07-05 NOTE — ED Notes (Signed)
Pt c/o nausea and diarrhea after eating dinner out last night. Pt denies pain, denies vomiting.

## 2013-07-05 NOTE — Discharge Instructions (Signed)
Viral Gastroenteritis Viral gastroenteritis is also known as stomach flu. This condition affects the stomach and intestinal tract. It can cause sudden diarrhea and vomiting. The illness typically lasts 3 to 8 days. Most people develop an immune response that eventually gets rid of the virus. While this natural response develops, the virus can make you quite ill. CAUSES  Many different viruses can cause gastroenteritis, such as rotavirus or noroviruses. You can catch one of these viruses by consuming contaminated food or water. You may also catch a virus by sharing utensils or other personal items with an infected person or by touching a contaminated surface. SYMPTOMS  The most common symptoms are diarrhea and vomiting. These problems can cause a severe loss of body fluids (dehydration) and a body salt (electrolyte) imbalance. Other symptoms may include:  Fever.  Headache.  Fatigue.  Abdominal pain. DIAGNOSIS  Your caregiver can usually diagnose viral gastroenteritis based on your symptoms and a physical exam. A stool sample may also be taken to test for the presence of viruses or other infections. TREATMENT  This illness typically goes away on its own. Treatments are aimed at rehydration. The most serious cases of viral gastroenteritis involve vomiting so severely that you are not able to keep fluids down. In these cases, fluids must be given through an intravenous line (IV). HOME CARE INSTRUCTIONS   Drink enough fluids to keep your urine clear or pale yellow. Drink small amounts of fluids frequently and increase the amounts as tolerated.  Ask your caregiver for specific rehydration instructions.  Avoid:  Foods high in sugar.  Alcohol.  Carbonated drinks.  Tobacco.  Juice.  Caffeine drinks.  Extremely hot or cold fluids.  Fatty, greasy foods.  Too much intake of anything at one time.  Dairy products until 24 to 48 hours after diarrhea stops.  You may consume probiotics.  Probiotics are active cultures of beneficial bacteria. They may lessen the amount and number of diarrheal stools in adults. Probiotics can be found in yogurt with active cultures and in supplements.  Wash your hands well to avoid spreading the virus.  Only take over-the-counter or prescription medicines for pain, discomfort, or fever as directed by your caregiver. Do not give aspirin to children. Antidiarrheal medicines are not recommended.  Ask your caregiver if you should continue to take your regular prescribed and over-the-counter medicines.  Keep all follow-up appointments as directed by your caregiver. SEEK IMMEDIATE MEDICAL CARE IF:   You are unable to keep fluids down.  You do not urinate at least once every 6 to 8 hours.  You develop shortness of breath.  You notice blood in your stool or vomit. This may look like coffee grounds.  You have abdominal pain that increases or is concentrated in one small area (localized).  You have persistent vomiting or diarrhea.  You have a fever.  The patient is a child younger than 3 months, and he or she has a fever.  The patient is a child older than 3 months, and he or she has a fever and persistent symptoms.  The patient is a child older than 3 months, and he or she has a fever and symptoms suddenly get worse.  The patient is a baby, and he or she has no tears when crying. MAKE SURE YOU:   Understand these instructions.  Will watch your condition.  Will get help right away if you are not doing well or get worse. Document Released: 05/26/2005 Document Revised: 08/18/2011 Document Reviewed: 03/12/2011   [employer] Patient Information 2014 [employer].  

## 2013-09-05 ENCOUNTER — Encounter (HOSPITAL_BASED_OUTPATIENT_CLINIC_OR_DEPARTMENT_OTHER): Payer: Self-pay | Admitting: Emergency Medicine

## 2013-09-05 ENCOUNTER — Emergency Department (HOSPITAL_BASED_OUTPATIENT_CLINIC_OR_DEPARTMENT_OTHER)
Admission: EM | Admit: 2013-09-05 | Discharge: 2013-09-05 | Disposition: A | Discharge status: Home / Self Care | Payer: Managed Care, Other (non HMO) | Attending: Emergency Medicine | Admitting: Emergency Medicine

## 2013-09-05 DIAGNOSIS — Z792 Long term (current) use of antibiotics: Secondary | ICD-10-CM | POA: Insufficient documentation

## 2013-09-05 DIAGNOSIS — Z8679 Personal history of other diseases of the circulatory system: Secondary | ICD-10-CM | POA: Insufficient documentation

## 2013-09-05 DIAGNOSIS — J069 Acute upper respiratory infection, unspecified: Secondary | ICD-10-CM | POA: Insufficient documentation

## 2013-09-05 DIAGNOSIS — R05 Cough: Secondary | ICD-10-CM

## 2013-09-05 DIAGNOSIS — R059 Cough, unspecified: Secondary | ICD-10-CM

## 2013-09-05 DIAGNOSIS — Z7982 Long term (current) use of aspirin: Secondary | ICD-10-CM | POA: Insufficient documentation

## 2013-09-05 DIAGNOSIS — Z79899 Other long term (current) drug therapy: Secondary | ICD-10-CM | POA: Insufficient documentation

## 2013-09-05 MED ORDER — ALBUTEROL SULFATE HFA 108 (90 BASE) MCG/ACT IN AERS: 1.0000 | INHALATION_SPRAY | Freq: Four times a day (QID) | RESPIRATORY_TRACT | Status: DC | PRN

## 2013-09-05 NOTE — ED Notes (Signed)
Pt with cough/congestion, initially dry and now productive.  Saw pcp and given tussionex and a zpack.  Denies fever.

## 2013-09-05 NOTE — ED Provider Notes (Signed)
Medical screening examination/treatment/procedure(s) were performed by non-physician practitioner and as supervising physician I was immediately available for consultation/collaboration.   EKG Interpretation None        Glynn OctaveStephen Dresean Beckel, MD 09/05/13 2027

## 2013-09-05 NOTE — Discharge Instructions (Signed)
Use inhaler every 4-6 hours as needed for cough. Continue taking azithromycin and using your cough medication as needed. Use nasal saline rinsesintermittently throughout the day.  Cool Mist Vaporizers Vaporizers may help relieve the symptoms of a cough and cold. They add moisture to the air, which helps mucus to become thinner and less sticky. This makes it easier to breathe and cough up secretions. Cool mist vaporizers do not cause serious burns like hot mist vaporizers ("steamers, humidifiers"). Vaporizers have not been proved to show they help with colds. You should not use a vaporizer if you are allergic to mold.  HOME CARE INSTRUCTIONS  Follow the package instructions for the vaporizer.  Do not use anything other than distilled water in the vaporizer.  Do not run the vaporizer all of the time. This can cause mold or bacteria to grow in the vaporizer.  Clean the vaporizer after each time it is used.  Clean and dry the vaporizer well before storing it.  Stop using the vaporizer if worsening respiratory symptoms develop. Document Released: 02/21/2004 Document Revised: 01/26/2013 Document Reviewed: 10/13/2012 [facility] Patient Information 2014 [place], [place].  Cough, Adult  A cough is a reflex that helps clear your throat and airways. It can help heal the body or may be a reaction to an irritated airway. A cough may only last 2 or 3 weeks (acute) or may last more than 8 weeks (chronic).  CAUSES Acute cough:  Viral or bacterial infections. Chronic cough:  Infections.  Allergies.  Asthma.  Post-nasal drip.  Smoking.  Heartburn or acid reflux.  Some medicines.  Chronic lung problems (COPD).  Cancer. SYMPTOMS   Cough.  Fever.  Chest pain.  Increased breathing rate.  High-pitched whistling sound when breathing (wheezing).  Colored mucus that you cough up (sputum). TREATMENT   A bacterial cough may be treated with antibiotic medicine.  A viral cough must run  its course and will not respond to antibiotics.  Your caregiver may recommend other treatments if you have a chronic cough. HOME CARE INSTRUCTIONS   Only take over-the-counter or prescription medicines for pain, discomfort, or fever as directed by your caregiver. Use cough suppressants only as directed by your caregiver.  Use a cold steam vaporizer or humidifier in your bedroom or home to help loosen secretions.  Sleep in a semi-upright position if your cough is worse at night.  Rest as needed.  Stop smoking if you smoke. SEEK IMMEDIATE MEDICAL CARE IF:   You have pus in your sputum.  Your cough starts to worsen.  You cannot control your cough with suppressants and are losing sleep.  You begin coughing up blood.  You have difficulty breathing.  You develop pain which is getting worse or is uncontrolled with medicine.  You have a fever. MAKE SURE YOU:   Understand these instructions.  Will watch your condition.  Will get help right away if you are not doing well or get worse. Document Released: 11/22/2010 Document Revised: 08/18/2011 Document Reviewed: 11/22/2010 [facility] Patient Information 2014 [place], [place].  Upper Respiratory Infection, Adult An upper respiratory infection (URI) is also sometimes known as the common cold. The upper respiratory tract includes the nose, sinuses, throat, trachea, and bronchi. Bronchi are the airways leading to the lungs. Most people improve within 1 week, but symptoms can last up to 2 weeks. A residual cough may last even longer.  CAUSES Many different viruses can infect the tissues lining the upper respiratory tract. The tissues become irritated and inflamed and  often become very moist. Mucus production is also common. A cold is contagious. You can easily spread the virus to others by oral contact. This includes kissing, sharing a glass, coughing, or sneezing. Touching your mouth or nose and then touching a surface, which is then touched  by another person, can also spread the virus. SYMPTOMS  Symptoms typically develop 1 to 3 days after you come in contact with a cold virus. Symptoms vary from person to person. They may include:  Runny nose.  Sneezing.  Nasal congestion.  Sinus irritation.  Sore throat.  Loss of voice (laryngitis).  Cough.  Fatigue.  Muscle aches.  Loss of appetite.  Headache.  Low-grade fever. DIAGNOSIS  You might diagnose your own cold based on familiar symptoms, since most people get a cold 2 to 3 times a year. Your caregiver can confirm this based on your exam. Most importantly, your caregiver can check that your symptoms are not due to another disease such as strep throat, sinusitis, pneumonia, asthma, or epiglottitis. Blood tests, throat tests, and X-rays are not necessary to diagnose a common cold, but they may sometimes be helpful in excluding other more serious diseases. Your caregiver will decide if any further tests are required. RISKS AND COMPLICATIONS  You may be at risk for a more severe case of the common cold if you smoke cigarettes, have chronic heart disease (such as heart failure) or lung disease (such as asthma), or if you have a weakened immune system. The very young and very old are also at risk for more serious infections. Bacterial sinusitis, middle ear infections, and bacterial pneumonia can complicate the common cold. The common cold can worsen asthma and chronic obstructive pulmonary disease (COPD). Sometimes, these complications can require emergency medical care and may be life-threatening. PREVENTION  The best way to protect against getting a cold is to practice good hygiene. Avoid oral or hand contact with people with cold symptoms. Wash your hands often if contact occurs. There is no clear evidence that vitamin C, vitamin E, echinacea, or exercise reduces the chance of developing a cold. However, it is always recommended to get plenty of rest and practice good  nutrition. TREATMENT  Treatment is directed at relieving symptoms. There is no cure. Antibiotics are not effective, because the infection is caused by a virus, not by bacteria. Treatment may include:  Increased fluid intake. Sports drinks offer valuable electrolytes, sugars, and fluids.  Breathing heated mist or steam (vaporizer or shower).  Eating chicken soup or other clear broths, and maintaining good nutrition.  Getting plenty of rest.  Using gargles or lozenges for comfort.  Controlling fevers with ibuprofen or acetaminophen as directed by your caregiver.  Increasing usage of your inhaler if you have asthma. Zinc gel and zinc lozenges, taken in the first 24 hours of the common cold, can shorten the duration and lessen the severity of symptoms. Pain medicines may help with fever, muscle aches, and throat pain. A variety of non-prescription medicines are available to treat congestion and runny nose. Your caregiver can make recommendations and may suggest nasal or lung inhalers for other symptoms.  HOME CARE INSTRUCTIONS   Only take over-the-counter or prescription medicines for pain, discomfort, or fever as directed by your caregiver.  Use a warm mist humidifier or inhale steam from a shower to increase air moisture. This may keep secretions moist and make it easier to breathe.  Drink enough water and fluids to keep your urine clear or pale yellow.  Rest  as needed.  Return to work when your temperature has returned to normal or as your caregiver advises. You may need to stay home longer to avoid infecting others. You can also use a face mask and careful hand washing to prevent spread of the virus. SEEK MEDICAL CARE IF:   After the first few days, you feel you are getting worse rather than better.  You need your caregiver's advice about medicines to control symptoms.  You develop chills, worsening shortness of breath, or Ellerbe or red sputum. These may be signs of  pneumonia.  You develop yellow or Rohlman nasal discharge or pain in the face, especially when you bend forward. These may be signs of sinusitis.  You develop a fever, swollen neck glands, pain with swallowing, or white areas in the back of your throat. These may be signs of strep throat. SEEK IMMEDIATE MEDICAL CARE IF:   You have a fever.  You develop severe or persistent headache, ear pain, sinus pain, or chest pain.  You develop wheezing, a prolonged cough, cough up blood, or have a change in your usual mucus (if you have chronic lung disease).  You develop sore muscles or a stiff neck. Document Released: 11/19/2000 Document Revised: 08/18/2011 Document Reviewed: 09/27/2010 [facility] Patient Information 2014 [place], [place].

## 2013-09-05 NOTE — ED Provider Notes (Signed)
CSN: [id number]     Arrival date & time 09/05/13  1555 History   First MD Initiated Contact with Patient 09/05/13 1606     Chief Complaint  Patient presents with  . URI     (Consider location/radiation/quality/duration/timing/severity/associated sxs/prior Treatment) HPI Comments: Patient is a 43 year old male who presents to the emergency department complaining of continued cough and congestion x3 days. Patient saw his primary care physician on Saturday and was given Tussionex and a Z-Pak, began taking the prescriptions yesterday. States his symptoms have continued and he occasionally coughs up clear mucus. Admits to associated nasal congestion and sore throat. Denies chest pain, shortness of breath, wheezing, fever, chills, nausea or vomiting.  Patient is a 43 y.o. male presenting with URI. The history is provided by the patient and the spouse.  URI Presenting symptoms: congestion and cough     Past Medical History  Diagnosis Date  . Blood clots in brain    Past Surgical History  Procedure Laterality Date  . Carpel tunnel     No family history on file. History  Substance Use Topics  . Smoking status: Never Smoker   . Smokeless tobacco: Not on file  . Alcohol Use: No    Review of Systems  HENT: Positive for congestion.   Respiratory: Positive for cough.   All other systems reviewed and are negative.      Allergies  Review of patient's allergies indicates no known allergies.  Home Medications   Current Outpatient Rx  Name  Route  Sig  Dispense  Refill  . azithromycin (ZITHROMAX) 250 MG tablet   Oral   Take 250 mg by mouth daily.         . chlorpheniramine-HYDROcodone (TUSSIONEX) 10-8 MG/5ML LQCR   Oral   Take 5 mLs by mouth.         Marland Kitchen. albuterol (PROVENTIL HFA;VENTOLIN HFA) 108 (90 BASE) MCG/ACT inhaler   Inhalation   Inhale 1-2 puffs into the lungs every 6 (six) hours as needed for wheezing or shortness of breath.   1 Inhaler   0   . aspirin EC 81 MG  tablet   Oral   Take 81 mg by mouth daily.         . ondansetron (ZOFRAN ODT) 8 MG disintegrating tablet   Oral   Take 1 tablet (8 mg total) by mouth every 8 (eight) hours as needed for nausea or vomiting.   20 tablet   0    BP 130/76  Pulse 66  Temp(Src) 99 F (37.2 C) (Oral)  Resp 18  Ht 5\' 8"  (1.727 m)  Wt 215 lb (97.523 kg)  BMI 32.70 kg/m2  SpO2 98% Physical Exam  Nursing note and vitals reviewed. Constitutional: He is oriented to person, place, and time. He appears well-developed and well-nourished. No distress.  HENT:  Head: Normocephalic and atraumatic.  Nose: Mucosal edema present.  Mouth/Throat: Posterior oropharyngeal erythema present. No oropharyngeal exudate or posterior oropharyngeal edema.  Nasal congestion. Post nasal drip.  Eyes: Conjunctivae are normal.  Neck: Normal range of motion. Neck supple.  Cardiovascular: Normal rate, regular rhythm and normal heart sounds.   Pulmonary/Chest: Effort normal and breath sounds normal. No respiratory distress. He has no wheezes. He has no rales. He exhibits no tenderness.  Musculoskeletal: Normal range of motion. He exhibits no edema.  Lymphadenopathy:    He has no cervical adenopathy.  Neurological: He is alert and oriented to person, place, and time.  Skin: Skin is warm and  dry. He is not diaphoretic.  Psychiatric: He has a normal mood and affect. His behavior is normal.    ED Course  Procedures (including critical care time) Labs Review Labs Reviewed - No data to display Imaging Review No results found.   EKG Interpretation None      MDM   Final diagnoses:  URI (upper respiratory infection)  Cough   Pt well appearing and in NAD. VSS. Lungs clear. He has only been on medications for 2 days. I advised him to continue medications, use nasal saline, albuterol inhaler as needed. F/u with PCP if no improvement. Return precautions given. Patient states understanding of treatment care plan and is  agreeable.   Trevor Mace, PA-C 09/05/13 [phone]

## 2013-12-29 IMAGING — CR DG CHEST 2V
2 series · 2 of 2 positions shown · non-contrast
Comparison: None

CLINICAL DATA: Anterior chest pain.

CHEST - 2 VIEW

[w chest pa]
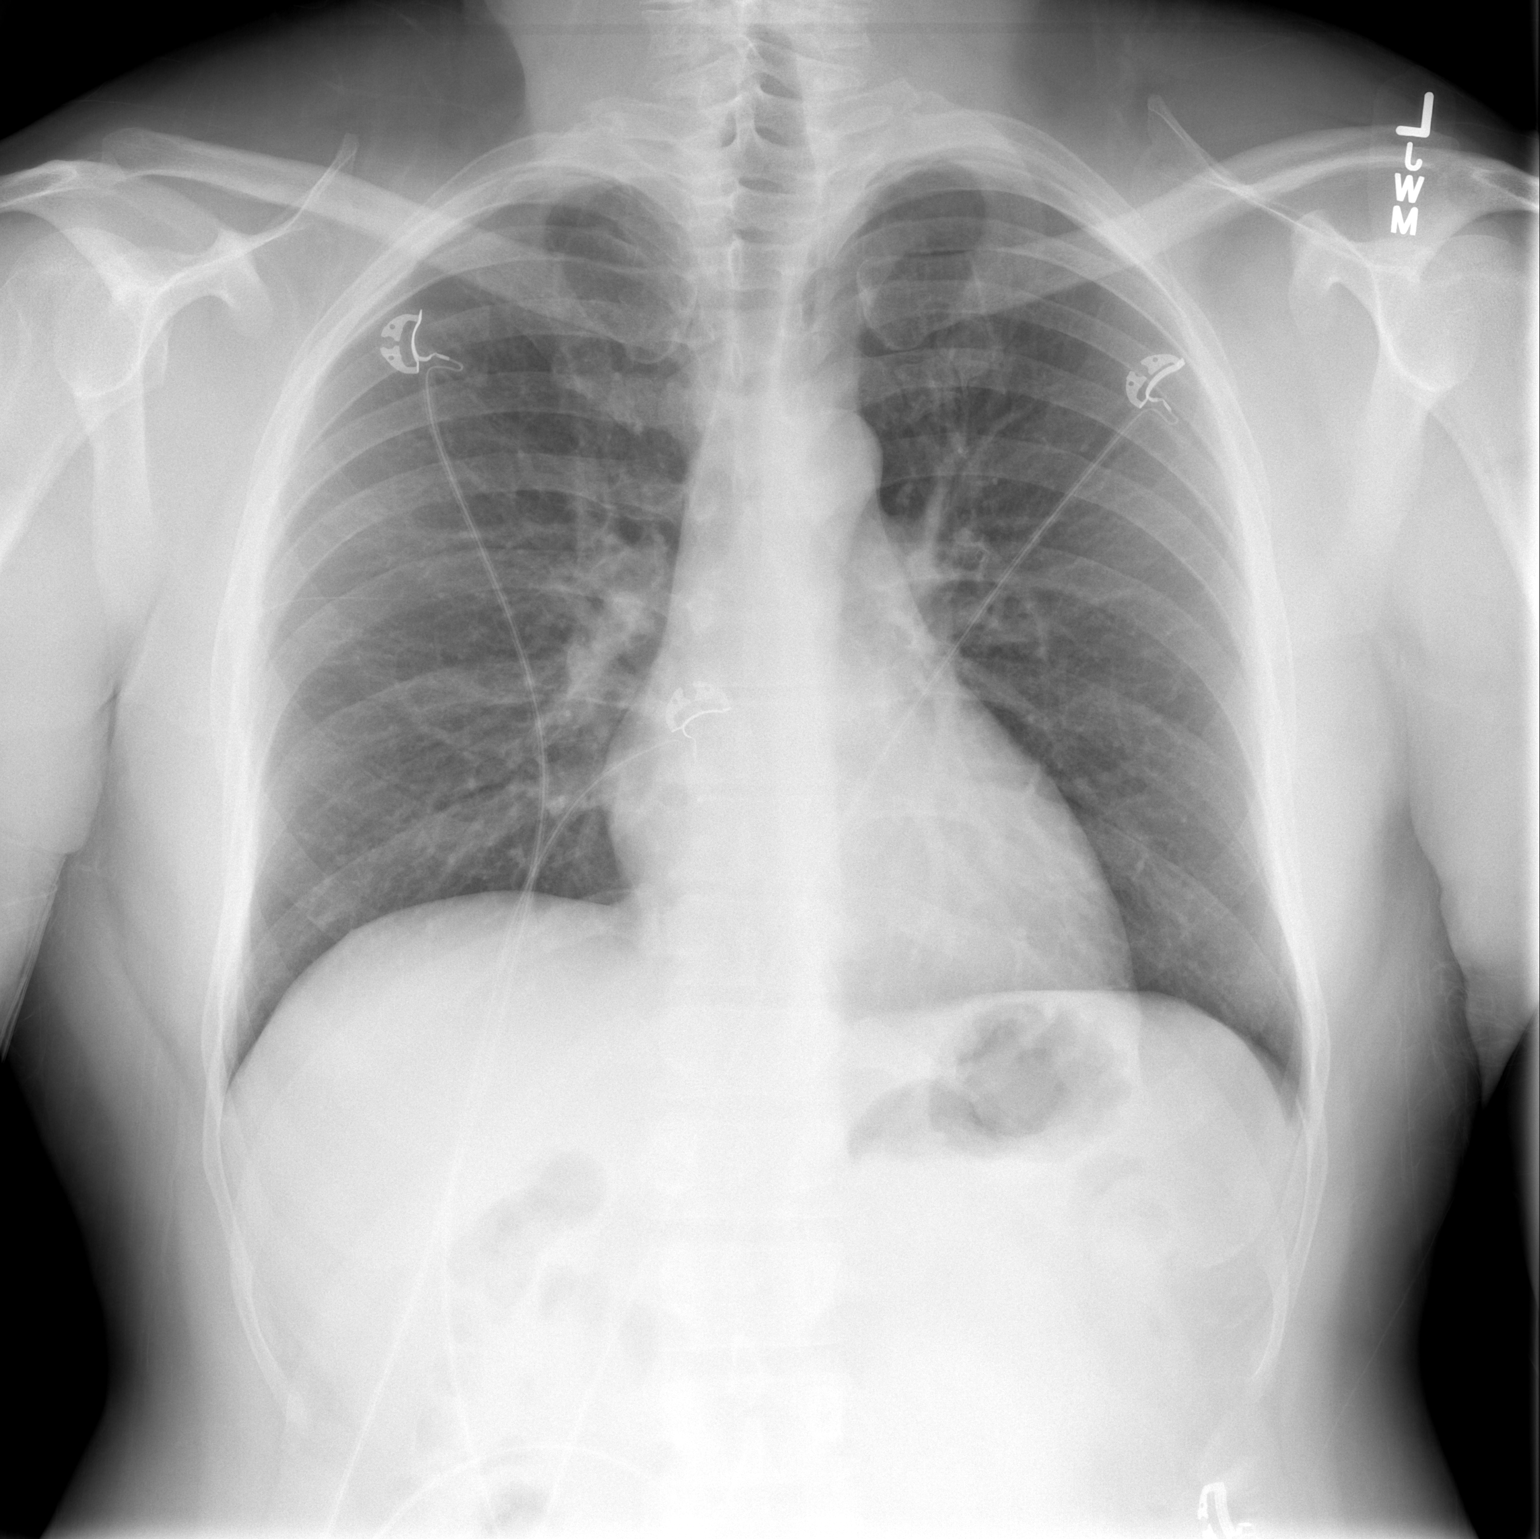

[w chest lat]
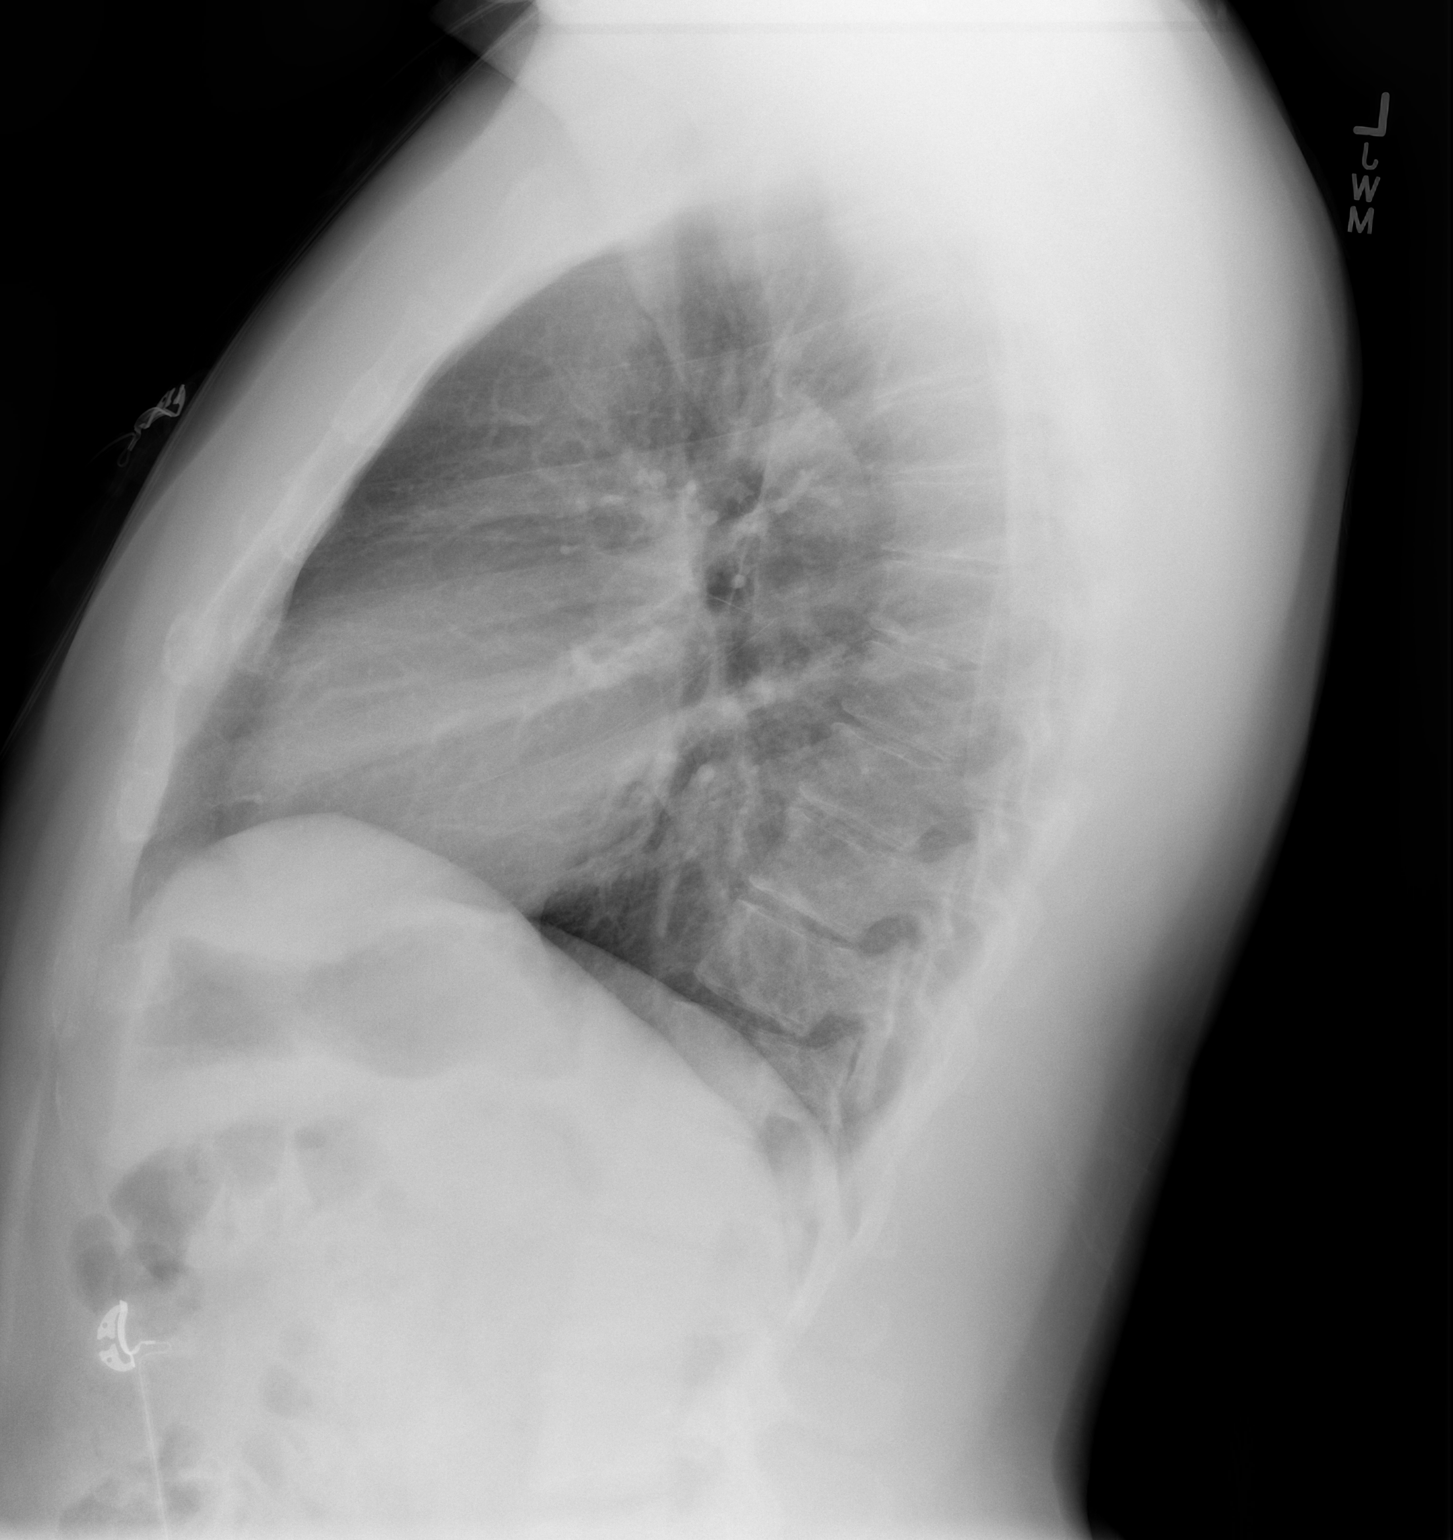

[2 of 2 positions shown; findings below may reference images not displayed]

FINDINGS: The heart size and mediastinal contours are within
normal limits.  Both lungs are clear.  No evidence of pneumothorax
or pleural effusion.  The visualized skeletal structures are
unremarkable.
IMPRESSION: No active cardiopulmonary disease.

## 2014-01-25 ENCOUNTER — Encounter (HOSPITAL_BASED_OUTPATIENT_CLINIC_OR_DEPARTMENT_OTHER): Payer: Self-pay | Admitting: Emergency Medicine

## 2014-01-25 ENCOUNTER — Emergency Department (HOSPITAL_BASED_OUTPATIENT_CLINIC_OR_DEPARTMENT_OTHER)
Admission: EM | Admit: 2014-01-25 | Discharge: 2014-01-25 | Disposition: A | Discharge status: Home / Self Care | Payer: Managed Care, Other (non HMO) | Attending: Emergency Medicine | Admitting: Emergency Medicine

## 2014-01-25 DIAGNOSIS — Z7982 Long term (current) use of aspirin: Secondary | ICD-10-CM | POA: Insufficient documentation

## 2014-01-25 DIAGNOSIS — Z9889 Other specified postprocedural states: Secondary | ICD-10-CM | POA: Diagnosis not present

## 2014-01-25 DIAGNOSIS — M7989 Other specified soft tissue disorders: Secondary | ICD-10-CM | POA: Diagnosis not present

## 2014-01-25 DIAGNOSIS — Z86718 Personal history of other venous thrombosis and embolism: Secondary | ICD-10-CM | POA: Diagnosis not present

## 2014-01-25 DIAGNOSIS — Z792 Long term (current) use of antibiotics: Secondary | ICD-10-CM | POA: Diagnosis not present

## 2014-01-25 NOTE — ED Provider Notes (Signed)
I saw and evaluated the patient, reviewed the resident's note and I agree with the findings and plan.   .Face to face Exam:  General:  Awake HEENT:  Atraumatic Resp:  Normal effort Abd:  Nondistended Neuro:No focal weakness   Nelia Shiobert L Luria Rosario, MD 01/25/14 1205

## 2014-01-25 NOTE — Discharge Instructions (Signed)
You were evaluated for Right hand swelling. We think that this is caused by over-use and possibly related to your Carpal Tunnel Syndrome, however without numbness, tingling, and pain, this is less likely. Recommend continue hand exercises, soft, splint, contrast therapy. May take Tylenol / Ibuprofen as needed for pain. No imaging or X-rays recommended at this time.  Please follow-up with your scheduled appointment to see Dr. Dierdre Searles on 8/26. Schedule close follow-up with your primary doctor as needed if symptoms worsen.  Carpal Tunnel Syndrome The carpal tunnel is a narrow area located on the palm side of your wrist. The tunnel is formed by the wrist bones and ligaments. Nerves, blood vessels, and tendons pass through the carpal tunnel. Repeated wrist motion or certain diseases may cause swelling within the tunnel. This swelling pinches the main nerve in the wrist (median nerve) and causes the painful hand and arm condition called carpal tunnel syndrome. CAUSES   Repeated wrist motions.  Wrist injuries.  Certain diseases like arthritis, diabetes, alcoholism, hyperthyroidism, and kidney failure.  Obesity.  Pregnancy. SYMPTOMS   A "pins and needles" feeling in your fingers or hand, especially in your thumb, index and middle fingers.  Tingling or numbness in your fingers or hand.  An aching feeling in your entire arm, especially when your wrist and elbow are bent for long periods of time.  Wrist pain that goes up your arm to your shoulder.  Pain that goes down into your palm or fingers.  A weak feeling in your hands. DIAGNOSIS  Your health care provider will take your history and perform a physical exam. An electromyography test may be needed. This test measures electrical signals sent out by your nerves into the muscles. The electrical signals are usually slowed by carpal tunnel syndrome. You may also need X-rays. TREATMENT  Carpal tunnel syndrome may clear up by itself. Your health  care provider may recommend a wrist splint or medicine such as a nonsteroidal anti-inflammatory medicine. Cortisone injections may help. Sometimes, surgery may be needed to free the pinched nerve.  HOME CARE INSTRUCTIONS   Take all medicine as directed by your health care provider. Only take over-the-counter or prescription medicines for pain, discomfort, or fever as directed by your health care provider.  If you were given a splint to keep your wrist from bending, wear it as directed. It is important to wear the splint at night. Wear the splint for as long as you have pain or numbness in your hand, arm, or wrist. This may take 1 to 2 months.  Rest your wrist from any activity that may be causing your pain. If your symptoms are work-related, you may need to talk to your employer about changing to a job that does not require using your wrist.  Put ice on your wrist after long periods of wrist activity.  Put ice in a plastic bag.  Place a towel between your skin and the bag.  Leave the ice on for 15-20 minutes, 03-04 times a day.  Keep all follow-up visits as directed by your health care provider. This includes any orthopedic referrals, physical therapy, and rehabilitation. Any delay in getting necessary care could result in a delay or failure of your condition to heal. SEEK IMMEDIATE MEDICAL CARE IF:   You have new, unexplained symptoms.  Your symptoms get worse and are not helped or controlled with medicines. MAKE SURE YOU:   Understand these instructions.  Will watch your condition.  Will get help right away if you  are not doing well or get worse. Document Released: 05/23/2000 Document Revised: 10/10/2013 Document Reviewed: 04/11/2011 [facility] Patient Information 2015 [employer] [place]. This information is not intended to replace advice given to you by your health care provider. Make sure you discuss any questions you have with your health care provider.

## 2014-01-25 NOTE — ED Provider Notes (Signed)
CSN: [id number]     Arrival date & time 01/25/14  1046 History   First MD Initiated Contact with Patient 01/25/14 1109   History provided by patient.   Chief Complaint  Patient presents with  . Hand Problem   HPI  Patient reports acute onset Right hand swelling since this morning, currently with significant resolution on arrival. Patient reports significant chronic problems with Right hand with PMH of Right hand carpal tunnel syndrome (s/p surgical correction 10/2012, recent MRI 12/2013), hx Right trigger finger index and middle. Patient states that he occasionally gets swelling in Right hand, but this morning it seemed to be worsen than previous, swelling localized to back of hand, palm, and some in fingers. Denies hand pain, numbness, tingling, redness, or other joint pain, fevers/chills. 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  Significant PMH as above with Right hand carpal tunnel, followed by Dr. Stephanie AcreZhongyu Li (Orthopedic Surgery, Hand specialist), has scheduled appointment next week 8/26 for follow-up. Also, currently doing hand therapy exercises and hot/cold baths.  Past Medical History  Diagnosis Date  . Blood clots in brain    Past Surgical History  Procedure Laterality Date  . Carpel tunnel     No family history on file. History  Substance Use Topics  . Smoking status: Never Smoker   . Smokeless tobacco: Not on file  . Alcohol Use: No    Review of Systems  See above HPI  Allergies  Review of patient's allergies indicates no known allergies.  Home Medications   Prior to Admission medications   Medication Sig Start Date End Date Taking? Authorizing Provider  albuterol (PROVENTIL HFA;VENTOLIN HFA) 108 (90 BASE) MCG/ACT inhaler Inhale 1-2 puffs into the lungs every 6 (six) hours as needed for wheezing or shortness of breath. 09/05/13   Trevor Maceobyn M Albert, PA-C   aspirin EC 81 MG tablet Take 81 mg by mouth daily.    Historical Provider, MD  azithromycin (ZITHROMAX) 250 MG tablet Take 250 mg by mouth daily.    Historical Provider, MD  chlorpheniramine-HYDROcodone (TUSSIONEX) 10-8 MG/5ML LQCR Take 5 mLs by mouth.    Historical Provider, MD  ondansetron (ZOFRAN ODT) 8 MG disintegrating tablet Take 1 tablet (8 mg total) by mouth every 8 (eight) hours as needed for nausea or vomiting. 07/05/13   Hilario Quarryanielle S Ray, MD   BP 124/68  Pulse 65  Temp(Src) 98.1 F (36.7 C) (Oral)  Resp 16  Ht 5\' 9"  (1.753 m)  Wt 210 lb (95.255 kg)  BMI 31.00 kg/m2  SpO2 100% Physical Exam  Gen - well-appearing, pleasant, NAD Heart - RRR, no murmurs heard MSK - Right Hand: no appreciable edema, non-tender, no erythema, R-long finger with persistent flexion and limited ROM with extension, R-wrist with full ROM active/passive without limitation or tenderness. Left hand normal appearing, no edema and non-tender. Right forearm without tenderness or edema. Ext - non-tender, no edema, peripheral pulses with radial +2 b/l Skin - warm, dry, no rashes Neuro - awake, alert, oriented, grossly non-focal, intact muscle strength 5/5 b/l including b/l grip strength, intact distal sensation to light touch, gait normal    ED Course  Procedures (including critical care time) Labs Review Labs Reviewed - No data to display  Imaging Review No results found.   EKG Interpretation None      MDM   Final diagnoses:  Swelling of right hand   42 yr M with significant  PMH chronic Right hand carpal tunnel syndrome s/p surgical correction 10/2012, Right hand trigger fingers, recent MRI 12/2013, followed by Dr. Dierdre Searles (Hand Ortho), presents with acute onset Right hand swelling (dorsum and palm) without tenderness, numbness, tingling, or weakness. Currently edema resolved on arrival, without residual symptoms. 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 No accident or injury. No further work-up or imaging at this time. Suspect acute swelling due to overuse in setting of carpal tunnel.  Discharge to home with reassurance, recommend close follow-up with Dr. Dierdre Searles as scheduled next week 8/26, advised continue soft wrist brace, Tylenol / NSAIDs PRN pain, continue hand exercises and therapy for relief. Will write note for out of work 8/19 through 8/21, return on Monday. Return precautions given.    Saralyn Pilar, DO 01/25/14 1156

## 2014-01-25 NOTE — ED Notes (Signed)
Pt c/o right hand swelling and pain this morning. Denies injury. Pt sts he had MRI last month for hand and will see Dr. Dierdre SearlesLi next week. Pt had carpel tunnel on right last year.

## 2014-02-28 ENCOUNTER — Ambulatory Visit: Payer: Managed Care, Other (non HMO) | Attending: Orthopedic Surgery | Admitting: Occupational Therapy

## 2014-02-28 DIAGNOSIS — M25549 Pain in joints of unspecified hand: Secondary | ICD-10-CM | POA: Insufficient documentation

## 2014-02-28 DIAGNOSIS — M25649 Stiffness of unspecified hand, not elsewhere classified: Secondary | ICD-10-CM | POA: Insufficient documentation

## 2014-02-28 DIAGNOSIS — R279 Unspecified lack of coordination: Secondary | ICD-10-CM | POA: Insufficient documentation

## 2014-02-28 DIAGNOSIS — IMO0001 Reserved for inherently not codable concepts without codable children: Secondary | ICD-10-CM | POA: Insufficient documentation

## 2014-03-07 ENCOUNTER — Ambulatory Visit: Payer: Managed Care, Other (non HMO) | Admitting: Occupational Therapy

## 2014-03-07 DIAGNOSIS — IMO0001 Reserved for inherently not codable concepts without codable children: Secondary | ICD-10-CM | POA: Diagnosis present

## 2014-03-07 DIAGNOSIS — M25649 Stiffness of unspecified hand, not elsewhere classified: Secondary | ICD-10-CM | POA: Diagnosis not present

## 2014-03-07 DIAGNOSIS — M25549 Pain in joints of unspecified hand: Secondary | ICD-10-CM | POA: Diagnosis not present

## 2014-03-07 DIAGNOSIS — R279 Unspecified lack of coordination: Secondary | ICD-10-CM | POA: Diagnosis not present

## 2014-03-09 ENCOUNTER — Ambulatory Visit: Payer: Managed Care, Other (non HMO) | Attending: Orthopedic Surgery | Admitting: Occupational Therapy

## 2014-03-09 DIAGNOSIS — M79644 Pain in right finger(s): Secondary | ICD-10-CM | POA: Insufficient documentation

## 2014-03-09 DIAGNOSIS — M79641 Pain in right hand: Secondary | ICD-10-CM | POA: Diagnosis present

## 2014-03-09 DIAGNOSIS — M25641 Stiffness of right hand, not elsewhere classified: Secondary | ICD-10-CM | POA: Insufficient documentation

## 2014-03-09 DIAGNOSIS — M25531 Pain in right wrist: Secondary | ICD-10-CM | POA: Diagnosis present

## 2014-03-09 DIAGNOSIS — R279 Unspecified lack of coordination: Secondary | ICD-10-CM | POA: Insufficient documentation

## 2014-03-09 DIAGNOSIS — Z9889 Other specified postprocedural states: Secondary | ICD-10-CM | POA: Diagnosis not present

## 2014-03-14 ENCOUNTER — Ambulatory Visit: Payer: Managed Care, Other (non HMO) | Admitting: Occupational Therapy

## 2014-03-14 DIAGNOSIS — M25531 Pain in right wrist: Secondary | ICD-10-CM | POA: Diagnosis not present

## 2014-03-16 ENCOUNTER — Ambulatory Visit: Payer: Managed Care, Other (non HMO) | Admitting: Occupational Therapy

## 2014-03-16 DIAGNOSIS — M25531 Pain in right wrist: Secondary | ICD-10-CM | POA: Diagnosis not present

## 2014-03-20 ENCOUNTER — Ambulatory Visit: Payer: Managed Care, Other (non HMO) | Admitting: Occupational Therapy

## 2014-03-20 DIAGNOSIS — M25531 Pain in right wrist: Secondary | ICD-10-CM | POA: Diagnosis not present

## 2014-03-22 ENCOUNTER — Encounter: Payer: Managed Care, Other (non HMO) | Admitting: Occupational Therapy

## 2014-03-24 ENCOUNTER — Ambulatory Visit: Payer: Managed Care, Other (non HMO) | Admitting: Occupational Therapy

## 2014-03-24 DIAGNOSIS — M25531 Pain in right wrist: Secondary | ICD-10-CM | POA: Diagnosis not present

## 2014-03-27 ENCOUNTER — Ambulatory Visit: Payer: Managed Care, Other (non HMO) | Admitting: Occupational Therapy

## 2014-03-27 DIAGNOSIS — M25531 Pain in right wrist: Secondary | ICD-10-CM | POA: Diagnosis not present

## 2014-03-31 ENCOUNTER — Ambulatory Visit: Payer: Managed Care, Other (non HMO) | Admitting: Occupational Therapy

## 2014-03-31 DIAGNOSIS — M25531 Pain in right wrist: Secondary | ICD-10-CM | POA: Diagnosis not present

## 2014-04-03 ENCOUNTER — Ambulatory Visit: Payer: Managed Care, Other (non HMO) | Admitting: Occupational Therapy

## 2014-04-03 DIAGNOSIS — M25531 Pain in right wrist: Secondary | ICD-10-CM | POA: Diagnosis not present

## 2014-04-06 ENCOUNTER — Ambulatory Visit: Payer: Managed Care, Other (non HMO) | Admitting: Occupational Therapy

## 2014-04-06 DIAGNOSIS — M25531 Pain in right wrist: Secondary | ICD-10-CM | POA: Diagnosis not present

## 2014-04-10 ENCOUNTER — Ambulatory Visit: Payer: Managed Care, Other (non HMO) | Attending: Orthopedic Surgery | Admitting: Occupational Therapy

## 2014-04-10 ENCOUNTER — Encounter: Payer: Self-pay | Admitting: Occupational Therapy

## 2014-04-10 DIAGNOSIS — M25641 Stiffness of right hand, not elsewhere classified: Secondary | ICD-10-CM | POA: Diagnosis not present

## 2014-04-10 DIAGNOSIS — M79644 Pain in right finger(s): Secondary | ICD-10-CM | POA: Diagnosis present

## 2014-04-10 DIAGNOSIS — M25531 Pain in right wrist: Secondary | ICD-10-CM | POA: Insufficient documentation

## 2014-04-10 DIAGNOSIS — Z9889 Other specified postprocedural states: Secondary | ICD-10-CM | POA: Insufficient documentation

## 2014-04-10 DIAGNOSIS — R279 Unspecified lack of coordination: Secondary | ICD-10-CM | POA: Insufficient documentation

## 2014-04-10 DIAGNOSIS — M79641 Pain in right hand: Secondary | ICD-10-CM | POA: Insufficient documentation

## 2014-04-10 DIAGNOSIS — M6281 Muscle weakness (generalized): Secondary | ICD-10-CM

## 2014-04-10 NOTE — Therapy (Addendum)
Occupational Therapy Treatment  Patient Details  Name: [name] MRN: [id number] Date of Birth: 11/29/1970 Visit 12 of 17  Encounter Date: 04/10/2014      OT End of Session - 04/10/14 0858    OT Stop Time 0845    OT Start Time       800  Past Medical History  Diagnosis Date  . Blood clots in brain     Past Surgical History  Procedure Laterality Date  . Carpel tunnel      There were no vitals taken for this visit.  Visit Diagnosis:  Hand pain, right  Stiffness of hand joint, right  Hand muscle weakness     Pt rates pain 4/10 in R hand (but mostly stiffness) with ice alleviating and playing aggravating.        OT Treatments/Exercises (OP) - 04/10/14 0700    Exercises Hand;Wrist   Fine Motor Coordination --  AROM:  isolated IP flex with MPs blocked, isolated MP flex   In Hand Manipulation Training --  PROM:  isolated MP flex, isolated IP flex with MP blocked   Modalities --   Ultrasound Location R palm at base of 3rd digit   Ultrasound Parameters 3.263mhz, 0.8 wts/cm2, 20% pulsed x 8min   Ultrasound Goals --  improved mobility, no adverse reactions   Type of Iontophoresis Dexamethasone   Location R palm at base of 3rd digit   Dose 1.5cc, intensity=1.8-2.0   Time 20min, no adverse reactions              OT Long Term Goals - 04/10/14 [id number]    Title Patient will verbalize understanding of current symptoms in hand and positions to avoid.   Time 3   Period Weeks   Status On-going   Title Patient will be independent with upgraded HEP.   Time 3   Period Weeks   Status On-going   Title Patient will demonstrate improved coordination in right hand as evidenced by decreasing 9 hole peg test by atleast 3 seconds.   Time 3   Period Weeks   Status On-going   Title Demonstrate increased pinch strength in right hand by 2 pounds combined to aid in work activities.   Time 3   Period Weeks   Status On-going   Title Report pain in hand no greater than 2/10.    Time 3   Period Weeks   Status On-going          Plan - 04/10/14 0850    OT Plan Ionto, ultrasound, progress as tolerated    Clinical Impression:  Pt with continued stiffness, decreased pain    Problem List There are no active problems to display for this patient.                                           [facility] Treatment Center LLCFREEMAN,ANGELA 04/10/2014, 9:03 AM

## 2014-04-12 ENCOUNTER — Encounter: Payer: Self-pay | Admitting: Occupational Therapy

## 2014-04-12 ENCOUNTER — Ambulatory Visit: Payer: Managed Care, Other (non HMO) | Admitting: Occupational Therapy

## 2014-04-12 DIAGNOSIS — M25531 Pain in right wrist: Secondary | ICD-10-CM | POA: Diagnosis not present

## 2014-04-12 DIAGNOSIS — M25641 Stiffness of right hand, not elsewhere classified: Secondary | ICD-10-CM

## 2014-04-12 DIAGNOSIS — M79641 Pain in right hand: Secondary | ICD-10-CM

## 2014-04-12 NOTE — Patient Instructions (Signed)
Pt educated to use splint and leave R UE/hand at rest as pt reports that he needs to use mouse R hand at work. As day goes on, pt can tell that he has been inadvertently  Fought against splint causing soreness/pain. Padding added to distal portion of splint in attempt to provide increaesd support and serve as reminder to not "fight against" splint during the day if possible.

## 2014-04-12 NOTE — Therapy (Signed)
Occupational Therapy Treatment  Patient Details  Name: [name] MRN: [id number] Date of Birth: 09/03/1970  Encounter Date: 04/12/2014      OT End of Session - 04/12/14 0850    Visit Number 13   Number of Visits 17   Date for OT Re-Evaluation 04/25/14   OT Start Time 0805   OT Stop Time 0853   OT Time Calculation (min) 48 min      Past Medical History  Diagnosis Date  . Blood clots in brain     Past Surgical History  Procedure Laterality Date  . Carpel tunnel      There were no vitals taken for this visit.  Visit Diagnosis:  Stiffness of hand joint, right  Hand pain, right          OT Treatments/Exercises (OP) - 04/12/14 0700    Exercises   Exercises Wrist;Hand  gentle tendon gliding, blocked exercises   Ultrasound   Ultrasound Location R palm   Ultrasound Parameters 3mHz @1 .0 w/cm2 x8 min   Ultrasound Goals --  No adverse reactions. Increased mobility   Iontophoresis   Type of Iontophoresis Dexamethasone   Location R palm at base of 3rd digit   Dose 1.5cc, intensity=1.8-2.0   Time 20min, no adverse reactions   Splinting   Splinting Replace velcro straps, added padding          Education - 04/12/14 0849    Education provided Yes   Education Details Pt educated to not "fight against" splint as possible and keep hand at rest during day/while at work.   Education Details Patient   Methods Explanation   Comprehension Verbalized understanding              Plan - 04/12/14 0909    OT Plan Ionto, ultrasound, progress as tolerated        Problem List There are no active problems to display for this patient.                                           Roselie AwkwardBarnhill, Ranie Chinchilla Beth Dixon 04/12/2014, 9:09 AM

## 2014-04-17 ENCOUNTER — Ambulatory Visit: Payer: Managed Care, Other (non HMO) | Admitting: Occupational Therapy

## 2014-04-20 ENCOUNTER — Ambulatory Visit: Payer: Managed Care, Other (non HMO) | Admitting: Occupational Therapy

## 2014-04-20 DIAGNOSIS — M25531 Pain in right wrist: Secondary | ICD-10-CM | POA: Diagnosis not present

## 2014-04-20 DIAGNOSIS — M79641 Pain in right hand: Secondary | ICD-10-CM

## 2014-04-20 NOTE — Therapy (Signed)
Occupational Therapy Treatment  Patient Details  Name: Gretel Acrenthony Ogburn MRN: [id number] Date of Birth: 07/21/1970  Encounter Date: 04/20/2014      OT End of Session - 04/20/14 1011    Visit Number 14   Number of Visits 17   Date for OT Re-Evaluation 04/25/14   OT Start Time 0811   OT Stop Time 0850   OT Time Calculation (min) 39 min   Activity Tolerance Patient tolerated treatment well   Behavior During Therapy [facility] AlabamaWFL for tasks assessed/performed      Past Medical History  Diagnosis Date  . Blood clots in brain     Past Surgical History  Procedure Laterality Date  . Carpel tunnel      There were no vitals taken for this visit.  Visit Diagnosis:  Hand pain, right      Subjective Assessment - 04/20/14 0817    Symptoms Pt denies pain, 0/10 R palm. Pt reports "a little bit of pain yesterday, not today"   Currently in Pain? No/denies   Pain Score 0-No pain            OT Treatments/Exercises (OP) - 04/20/14 0001    Exercises   Exercises Wrist;Hand  Gentle tendon gliding, blocked exercies   Ultrasound   Ultrasound Location --  R palm   Ultrasound Parameters --  3mHz @ 1.0 w/cm2 x768min   Ultrasound Goals Edema;Pain   Iontophoresis   Type of Iontophoresis Dexamethasone   Location R palm at base of 3rd digit   Dose 1.5cc, intensity 1.8-2.2   Time 20min, no adverse reactions   Splinting   Splinting Replace velcro straps, added padding   Manual Therapy   Manual Therapy Other (comment)  Cross friction massage x685min to increase circulation, edema   Other Manual Therapy --  R palm, base of 3rd digit    Pt instructed to wear splint and avoid fighting against splint especially while at work and using mouse. Discussed possibility of removing splint when resting hand on mouse at work as it appears that he if fighting against it. He was educated to then replace splint for all other activity and see if this assists w/ decreased symptoms. Pt verbalized understanding of this  and stated that he has done this "a few times if I know" he's fighting against splint w/ right hand.  Reviewed all splint use, wear and care as well as gentle blocked flexion ex's, elevation for edema control PRN.      OT Education - 04/20/14 1010    Education provided Yes   Person(s) Educated Patient   Methods Explanation;Demonstration   Comprehension Verbalized understanding              Plan - 04/20/14 1011    Clinical Impression Statement Pt has completed 4 ionto sessions as of today, he is currently w/o pain and appears to fight against splint with increased activity at work and home.   Plan Ionto, ultrasound, begin increasing activity, assess goals next visit.        Problem List There are no active problems to display for this patient.                                             Roselie AwkwardBarnhill, Amy Beth Dixon 04/20/2014, 10:17 AM

## 2014-04-20 NOTE — Patient Instructions (Signed)
Pt instructed to wear splint and avoid fighting against splint especially while at work and using mouse. Discussed possibility of removing splint when resting hand on mouse at work as it appears that he if fighting against it. He was educated to then replace splint for all other activity and see if this assists w/ decreased symptoms. Pt verbalized understanding of this and stated that he has done this "a few times if I know" he's fighting against splint w/ right hand.  Reviewed all splint use, wear and care as well as gentle blocked flexion ex's, elevation for edema control PRN.

## 2014-04-25 ENCOUNTER — Ambulatory Visit: Payer: Managed Care, Other (non HMO) | Admitting: Occupational Therapy

## 2014-04-25 ENCOUNTER — Encounter: Payer: Self-pay | Admitting: Occupational Therapy

## 2014-04-25 DIAGNOSIS — M79641 Pain in right hand: Secondary | ICD-10-CM

## 2014-04-25 DIAGNOSIS — M25641 Stiffness of right hand, not elsewhere classified: Secondary | ICD-10-CM

## 2014-04-25 DIAGNOSIS — M6281 Muscle weakness (generalized): Secondary | ICD-10-CM

## 2014-04-25 DIAGNOSIS — M25531 Pain in right wrist: Secondary | ICD-10-CM | POA: Diagnosis not present

## 2014-04-25 NOTE — Therapy (Signed)
Occupational Therapy Treatment  Patient Details  Name: [name] MRN: [id number] Date of Birth: 06/18/1970  Encounter Date: 04/25/2014      OT End of Session - 04/25/14 1535    Visit Number 15   Number of Visits 17   Date for OT Re-Evaluation 04/25/14   OT Start Time 1500  pt arrived late   OT Stop Time 1546   OT Time Calculation (min) 46 min   Activity Tolerance Patient tolerated treatment well      Past Medical History  Diagnosis Date  . Blood clots in brain     Past Surgical History  Procedure Laterality Date  . Carpel tunnel      There were no vitals taken for this visit.  Visit Diagnosis:  Hand pain, right  Stiffness of hand joint, right  Hand muscle weakness      Subjective Assessment - 04/25/14 1528    Symptoms My pain is no more than 1/0.  My finger is still "sticking" in flexion.   Currently in Pain? Yes   Pain Score 1    Pain Location Hand  palm side base of middle finger   Pain Orientation Right   Aggravating Factors  playing bass guitar, typing at work   Pain Relieving Factors ice, massage, rest            OT Treatments/Exercises (OP) - 04/25/14 0001    Modalities   Modalities Ultrasound;Iontophoresis  8 min/ 20%/, .8 to address adhesion at base of middle finger   Ultrasound   Ultrasound Location base of middle finger right hand   Iontophoresis   Type of Iontophoresis Dexamethasone   Location base of middle finger palm side of hand   Dose 1.5cc intensity 2.4   Time 16.5 minutes   Manual Therapy   Manual Therapy --  cross frictional massage x5 minutes to increase circulation              OT Long Term Goals - 04/25/14 1545    OT LONG TERM GOAL #1   Title Patient will verbalize understanding of current symptoms in hand and positions to avoid.   Status Achieved   OT LONG TERM GOAL #2   Title Patient will be independent with upgraded HEP.   Status On-going   OT LONG TERM GOAL #3   Title Patient will demonstrate  improved coordination in right hand as evidenced by decreasing 9 hole peg test by atleast 3 seconds.   Status On-going   OT LONG TERM GOAL #4   Title Demonstrate increased pinch strength in right hand by 2 pounds combined to aid in work activities.   Status On-going          Plan - 04/25/14 1536    Clinical Impression Statement Pt has completed 5 ionto sessions as of today. Patient rated pain 1/10 in hand however states finger still "sticks" in flexion especially when playing bass guitar.  Strongly encouraged patient to try pick to change hand position. Patient agreeable.   Rehab Potential Good   OT Frequency 2x / week   OT Duration 8 weeks   OT Treatment/Interventions Iontophoresis;Ultrasound;Moist Heat;Therapeutic exercise;Manual Therapy;Splinting;Patient/family education   Plan ultrasound, ionto, assess goals next visit.        Problem List There are no active problems to display for this patient.  Mackie PaiKaren Enrigue Hashimi, MS, OTR/L [facility] [address], [address] [place], [place] [address] [phone] Phone [phone] Fax  Norton Pastelulaski, Muaad Boehning Halliday 04/25/2014, 3:49 PM

## 2014-04-27 ENCOUNTER — Ambulatory Visit: Payer: Managed Care, Other (non HMO) | Admitting: Occupational Therapy

## 2014-05-01 ENCOUNTER — Ambulatory Visit: Payer: Managed Care, Other (non HMO) | Admitting: Occupational Therapy

## 2014-05-01 DIAGNOSIS — M79641 Pain in right hand: Secondary | ICD-10-CM

## 2014-05-01 DIAGNOSIS — M25531 Pain in right wrist: Secondary | ICD-10-CM | POA: Diagnosis not present

## 2014-05-01 DIAGNOSIS — M6281 Muscle weakness (generalized): Secondary | ICD-10-CM

## 2014-05-01 DIAGNOSIS — M25641 Stiffness of right hand, not elsewhere classified: Secondary | ICD-10-CM

## 2014-05-01 NOTE — Therapy (Signed)
Occupational Therapy Treatment  Patient Details  Name: [name] MRN: [id number] Date of Birth: February 08, 1971  Encounter Date: 05/01/2014      OT End of Session - 05/01/14 1033    Visit Number 16   Number of Visits 16   OT Start Time 0810   OT Stop Time 0845   OT Time Calculation (min) 35 min   Activity Tolerance Patient tolerated treatment well   Behavior During Therapy [facility] for tasks assessed/performed      Past Medical History  Diagnosis Date  . Blood clots in brain     Past Surgical History  Procedure Laterality Date  . Carpel tunnel      There were no vitals taken for this visit.  Visit Diagnosis:  Hand pain, right  Stiffness of hand joint, right  Hand muscle weakness      Subjective Assessment - 05/01/14 0813    Currently in Pain? Yes   Pain Score 3    Pain Location Hand   Pain Orientation Right   Pain Descriptors / Indicators Aching   Pain Type Chronic pain   Pain Onset More than a month ago   Aggravating Factors  playing guitar, typing/ using mouse at work   Pain Relieving Factors ice, rest   Multiple Pain Sites No              OT Education - 05/01/14 1635    Education provided Yes   Education Details Reviewed splint wear   Person(s) Educated Patient   Methods Explanation   Comprehension Verbalized understanding            OT Long Term Goals - 05/01/14 0814    OT LONG TERM GOAL #1   Title Patient will verbalize understanding of current symptoms in hand and positions to avoid.   Status Achieved   OT LONG TERM GOAL #2   Title Patient will be independent with upgraded HEP.   Baseline HEP was not upgraded as pt continues to experience catching/ triggering and pain   Period Weeks   Status Not Met   OT LONG TERM GOAL #3   Title Patient will demonstrate improved coordination in right hand as evidenced by decreasing 9 hole peg test by atleast 3 seconds.   Baseline 9 hole peg test 24.53 secs, RUE on 05/01/14, (initial 20.63 secs)   Status Not Met   OT LONG TERM GOAL #4   Title Demonstrate increased pinch strength in right hand by 2 pounds combined to aid in work activities.   Baseline RUE 3 pt pinch: 20 lbs, 2-pt: 12 lbs on 05/01/14, (inital 3 pt: 24 lbs, 2 pt-15 lbs)   Status Not Met   OT LONG TERM GOAL #5   Title Report pain in hand no greater than 2/10.   Baseline Pain1-3/10 in RUE   Status Not Met          Plan - 05/01/14 1636    Clinical Impression Statement Checked progress towards goals. Iontophoresis 1.5 cc dexamethasone, intensity 2.0-2.2 mA x 19 mins, no adverse reaction(fingers maintained in extension with 2 lb cuff weight).(6/6 tx completed.) Therapist added new padding and strapping to pt's splint. Therapist recommends pt. follow up with Dr Amedeo Plenty as his finger continues to catch and trigger. Pt has not yet pursued a pick for playing guitar, therapist encourged pt  to consider in order to minimize symptoms.   Rehab Potential Good   OT Frequency 2x / week   OT Duration 8 weeks  OT Treatment/Interventions Iontophoresis;Ultrasound;Moist Heat;Therapeutic exercise;Manual Therapy;Splinting;Patient/family education;Self-care/ADL training   Plan discharge OT as pt's pain has decreased significantly   OT Home Exercise Plan A/ROM PIP with MP blocked   Consulted and Agree with Plan of Care Patient        Problem List There are no active problems to display for this patient.                      OCCUPATIONAL THERAPY DISCHARGE SUMMARY   Current functional level related to goals / functional outcomes: Pt met 1/5 goals, however his pain has decreased significantly overall. Pt. reports continued catching and triggering in his right hand with use. Pt has received 6 iontophoresis treatments and has been educated regarding activity modification and splint wear. 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   Remaining  deficits: Pain in right hand 1-3/10, ice, iontophoresis alleviates/ overuse aggravates. Decreased RUE strength   Education / Equipment: Pt was educated regarding the following: splint wear/ care, activity modification and HEP. Pt verbalized understanding of all education. Therapist has recommended pt follow up with his physician as he continues to report catching/ triggering in his right hand despite 6 iontophoresis treatments.  Plan: Patient agrees to discharge.  Patient goals were not met.Pt is being d/c due to reaching maximal potential and unable to progress further in therapy.                                                                                                      ?????                           Theone Murdoch, OTR/L [phone] Phone: [phone] 4:53 PM 05/01/2014  RINE,KATHRYN 05/01/2014, 4:53 PM

## 2014-05-08 ENCOUNTER — Ambulatory Visit: Payer: Managed Care, Other (non HMO) | Admitting: Occupational Therapy

## 2015-02-07 ENCOUNTER — Encounter: Payer: Self-pay | Admitting: Occupational Therapy

## 2015-10-10 ENCOUNTER — Emergency Department (HOSPITAL_BASED_OUTPATIENT_CLINIC_OR_DEPARTMENT_OTHER)
Admission: EM | Admit: 2015-10-10 | Discharge: 2015-10-10 | Disposition: A | Discharge status: Home / Self Care | Payer: Managed Care, Other (non HMO) | Attending: Emergency Medicine | Admitting: Emergency Medicine

## 2015-10-10 ENCOUNTER — Emergency Department (HOSPITAL_BASED_OUTPATIENT_CLINIC_OR_DEPARTMENT_OTHER): Payer: Managed Care, Other (non HMO)

## 2015-10-10 ENCOUNTER — Encounter (HOSPITAL_BASED_OUTPATIENT_CLINIC_OR_DEPARTMENT_OTHER): Payer: Self-pay

## 2015-10-10 DIAGNOSIS — K219 Gastro-esophageal reflux disease without esophagitis: Secondary | ICD-10-CM

## 2015-10-10 DIAGNOSIS — K210 Gastro-esophageal reflux disease with esophagitis: Secondary | ICD-10-CM | POA: Insufficient documentation

## 2015-10-10 MED ORDER — OMEPRAZOLE 20 MG PO CPDR: 20.0000 mg | DELAYED_RELEASE_CAPSULE | Freq: Every day | ORAL | Status: DC

## 2015-10-10 MED ORDER — FAMOTIDINE 20 MG PO TABS
20.0000 mg | ORAL_TABLET | Freq: Once | ORAL | Status: AC
  Administered 2015-10-10: 20 mg via ORAL
  Filled 2015-10-10: qty 1

## 2015-10-10 MED ORDER — GI COCKTAIL ~~LOC~~
30.0000 mL | Freq: Once | ORAL | Status: AC
  Administered 2015-10-10: 30 mL via ORAL
  Filled 2015-10-10: qty 30

## 2015-10-10 MED ORDER — SUCRALFATE 1 GM/10ML PO SUSP: 1.0000 g | Freq: Three times a day (TID) | ORAL | Status: DC

## 2015-10-10 NOTE — ED Notes (Signed)
Pt verbalizes understanding of d/c instructions and denies any further needs at this time. 

## 2015-10-10 NOTE — ED Notes (Signed)
Pt c/o cough for 4-5 days that is not relieved by OTC meds.  He has been using an inhaler that he received the last visit here, but determined to be expired.  Pt states the inhaler helped last time though.

## 2015-10-10 NOTE — ED Provider Notes (Addendum)
CSN: [id number]     Arrival date & time 10/10/15  0212 History   First MD Initiated Contact with Patient 10/10/15 0400     Chief Complaint  Patient presents with  . Cough     (Consider location/radiation/quality/duration/timing/severity/associated sxs/prior Treatment) Patient is a 45 y.o. male presenting with cough. The history is provided by the patient.  Cough Cough characteristics:  Non-productive Severity:  Moderate Onset quality:  Gradual Timing:  Intermittent Progression:  Unchanged Chronicity:  New Smoker: no   Context: not sick contacts   Relieved by:  Nothing Worsened by:  Lying down Ineffective treatments:  Beta-agonist inhaler Associated symptoms: no chest pain, no shortness of breath and no sore throat   Risk factors: no chemical exposure   When lying flat at night and after meals.  Non productive.  No CP no DOE no f/c/r.  No leg swelling  Past Medical History  Diagnosis Date  . Blood clots in brain    Past Surgical History  Procedure Laterality Date  . Carpel tunnel     History reviewed. No pertinent family history. Social History  Substance Use Topics  . Smoking status: Never Smoker   . Smokeless tobacco: None  . Alcohol Use: No    Review of Systems  HENT: Negative for sore throat.   Respiratory: Positive for cough. Negative for shortness of breath.   Cardiovascular: Negative for chest pain.  Gastrointestinal:       Gassy  All other systems reviewed and are negative.     Allergies  Review of patient's allergies indicates no known allergies.  Home Medications   Prior to Admission medications   Medication Sig Start Date End Date Taking? Authorizing Provider  albuterol (PROVENTIL HFA;VENTOLIN HFA) 108 (90 BASE) MCG/ACT inhaler Inhale 1-2 puffs into the lungs every 6 (six) hours as needed for wheezing or shortness of breath. 09/05/13   Kathrynn Speed, PA-C  aspirin EC 81 MG tablet Take 81 mg by mouth daily.    Historical Provider, MD  azithromycin  (ZITHROMAX) 250 MG tablet Take 250 mg by mouth daily.    Historical Provider, MD  chlorpheniramine-HYDROcodone (TUSSIONEX) 10-8 MG/5ML LQCR Take 5 mLs by mouth.    Historical Provider, MD  ondansetron (ZOFRAN ODT) 8 MG disintegrating tablet Take 1 tablet (8 mg total) by mouth every 8 (eight) hours as needed for nausea or vomiting. 07/05/13   Margarita Grizzle, MD   BP 152/80 mmHg  Pulse 68  Temp(Src) 98.5 F (36.9 C) (Oral)  Resp 18  Ht  (1.778 m)  Wt 220 lb (99.791 kg)  BMI 31.57 kg/m2  SpO2 100% Physical Exam  Constitutional: He is oriented to person, place, and time. He appears well-developed and well-nourished. No distress.  HENT:  Head: Normocephalic and atraumatic.  Mouth/Throat: Oropharynx is clear and moist.  Eyes: Conjunctivae are normal. Pupils are equal, round, and reactive to light.  Neck: Normal range of motion. Neck supple.  Cardiovascular: Normal rate, regular rhythm and intact distal pulses.   Pulmonary/Chest: Effort normal and breath sounds normal. No stridor. No respiratory distress. He has no wheezes. He has no rales.  Abdominal: Soft. There is no tenderness. There is no rebound and no guarding.  Gassy with bowel sounds and gurgling heard into the thoracic cavity  Musculoskeletal: Normal range of motion. He exhibits no edema or tenderness.  Lymphadenopathy:    He has no cervical adenopathy.  Neurological: He is alert and oriented to person, place, and time.  Skin: Skin is  warm and dry.  Psychiatric: He has a normal mood and affect.    ED Course  Procedures (including critical care time) Labs Review Labs Reviewed - No data to display  Imaging Review Dg Chest 2 View  10/10/2015  CLINICAL DATA:  Cough and congestion for 1 week.  Nonsmoker. EXAM: CHEST  2 VIEW COMPARISON:  03/04/2012 FINDINGS: The heart size and mediastinal contours are within normal limits. Both lungs are clear. The visualized skeletal structures are unremarkable. IMPRESSION: No active  cardiopulmonary disease. Electronically Signed   By: Burman NievesWilliam  Stevens M.D.   On: 10/10/2015 02:46   I have personally reviewed and evaluated these images and lab results as part of my medical decision-making.   EKG Interpretation None      MDM   Final diagnoses:  None    Filed Vitals:   10/10/15 0215  BP: 152/80  Pulse: 68  Temp: 98.5 F (36.9 C)  Resp: 18   Results for orders placed or performed during the hospital encounter of 03/04/12  CBC  Result Value Ref Range   WBC 4.5 4.0 - 10.5 K/uL   RBC 4.44 4.22 - 5.81 MIL/uL   Hemoglobin 13.7 13.0 - 17.0 g/dL   HCT 16.138.7 (L) 09.639.0 - 04.552.0 %   MCV 87.2 78.0 - 100.0 fL   MCH 30.9 26.0 - 34.0 pg   MCHC 35.4 30.0 - 36.0 g/dL   RDW 40.911.7 81.111.5 - 91.415.5 %   Platelets 254 150 - 400 K/uL  Basic metabolic panel  Result Value Ref Range   Sodium 139 135 - 145 mEq/L   Potassium 4.0 3.5 - 5.1 mEq/L   Chloride 101 96 - 112 mEq/L   CO2 28 19 - 32 mEq/L   Glucose, Bld 94 70 - 99 mg/dL   BUN 13 6 - 23 mg/dL   Creatinine, Ser 7.821.20 0.50 - 1.35 mg/dL   Calcium 9.5 8.4 - 95.610.5 mg/dL   GFR calc non Af Amer 73 (L) >90 mL/min   GFR calc Af Amer 85 (L) >90 mL/min  Troponin I  Result Value Ref Range   Troponin I <0.30 <0.30 ng/mL   Dg Chest 2 View  10/10/2015  CLINICAL DATA:  Cough and congestion for 1 week.  Nonsmoker. EXAM: CHEST  2 VIEW COMPARISON:  03/04/2012 FINDINGS: The heart size and mediastinal contours are within normal limits. Both lungs are clear. The visualized skeletal structures are unremarkable. IMPRESSION: No active cardiopulmonary disease. Electronically Signed   By: Burman NievesWilliam  Stevens M.D.   On: 10/10/2015 02:46     Medications  famotidine (PEPCID) tablet 20 mg (20 mg Oral Given 10/10/15 0422)  gi cocktail (Maalox,Lidocaine,Donnatal) (30 mLs Oral Given 10/10/15 0422)    Exam and history consistent with GERD.  Will treat with carafate PPI and GERD friendly diet.  Follow up with your PMD strict return precautions given   Jermia Rigsby, MD 10/10/15 0426  Jayne Peckenpaugh, MD 10/10/15 [phone]

## 2015-10-10 NOTE — ED Notes (Signed)
Pt c/o cough and congestion x1wk, using OTC and inhaler with no relief

## 2015-10-30 ENCOUNTER — Emergency Department (HOSPITAL_BASED_OUTPATIENT_CLINIC_OR_DEPARTMENT_OTHER)
Admission: EM | Admit: 2015-10-30 | Discharge: 2015-10-30 | Disposition: A | Discharge status: Home / Self Care | Payer: Managed Care, Other (non HMO) | Attending: Emergency Medicine | Admitting: Emergency Medicine

## 2015-10-30 ENCOUNTER — Encounter (HOSPITAL_BASED_OUTPATIENT_CLINIC_OR_DEPARTMENT_OTHER): Payer: Self-pay | Admitting: *Deleted

## 2015-10-30 DIAGNOSIS — N509 Disorder of male genital organs, unspecified: Secondary | ICD-10-CM | POA: Insufficient documentation

## 2015-10-30 DIAGNOSIS — Z7982 Long term (current) use of aspirin: Secondary | ICD-10-CM | POA: Insufficient documentation

## 2015-10-30 DIAGNOSIS — N489 Disorder of penis, unspecified: Secondary | ICD-10-CM

## 2015-10-30 DIAGNOSIS — R591 Generalized enlarged lymph nodes: Secondary | ICD-10-CM | POA: Insufficient documentation

## 2015-10-30 LAB — URINE MICROSCOPIC-ADD ON

## 2015-10-30 LAB — URINALYSIS, ROUTINE W REFLEX MICROSCOPIC
Bilirubin Urine: NEGATIVE
GLUCOSE, UA: NEGATIVE mg/dL
Ketones, ur: NEGATIVE mg/dL
LEUKOCYTES UA: NEGATIVE
NITRITE: NEGATIVE
PROTEIN: NEGATIVE mg/dL
Specific Gravity, Urine: 1.008 (ref 1.005–1.030)
pH: 5.5 (ref 5.0–8.0)

## 2015-10-30 NOTE — ED Notes (Signed)
C/o rt groin pain onset yesterday  Denies inj

## 2015-10-30 NOTE — ED Provider Notes (Signed)
CSN: [id number]     Arrival date & time 10/30/15  1830 History   First MD Initiated Contact with Patient 10/30/15 1839     Chief Complaint  Patient presents with  . Groin Pain    (Consider location/radiation/quality/duration/timing/severity/associated sxs/prior Treatment) Patient is a 45 y.o. male presenting with groin pain. The history is provided by the patient and medical records. No language interpreter was used.  Groin Pain Pertinent negatives include no fever.  Gretel Acrenthony Mehringer is a 45 y.o. male  who presents to the Emergency Department complaining of a nonpainful lesion on the dorsum of his penis. Patient states lesion appeared approximately 2 days ago. He had a similar presentation one year ago which resolved without being medically evaluated and has had no problems since. Associated symptoms include tender "knots" of his groin which he believes are lymph nodes. Admits to unprotected sex multiple times in the last 6 months. Denies fever, dysuria, penile discharge, penile/scrotal swelling or tenderness.   Past Medical History  Diagnosis Date  . Blood clots in brain    Past Surgical History  Procedure Laterality Date  . Carpel tunnel     No family history on file. Social History  Substance Use Topics  . Smoking status: Never Smoker   . Smokeless tobacco: None  . Alcohol Use: No    Review of Systems  Constitutional: Negative for fever.  Genitourinary: Negative for dysuria, discharge, penile swelling, scrotal swelling, penile pain and testicular pain.      Allergies  Review of patient's allergies indicates no known allergies.  Home Medications   Prior to Admission medications   Medication Sig Start Date End Date Taking? Authorizing Provider  albuterol (PROVENTIL HFA;VENTOLIN HFA) 108 (90 BASE) MCG/ACT inhaler Inhale 1-2 puffs into the lungs every 6 (six) hours as needed for wheezing or shortness of breath. 09/05/13   Kathrynn Speedobyn M Hess, PA-C  aspirin EC 81 MG tablet Take 81 mg  by mouth daily.    Historical Provider, MD  azithromycin (ZITHROMAX) 250 MG tablet Take 250 mg by mouth daily.    Historical Provider, MD  chlorpheniramine-HYDROcodone (TUSSIONEX) 10-8 MG/5ML LQCR Take 5 mLs by mouth.    Historical Provider, MD  omeprazole (PRILOSEC) 20 MG capsule Take 1 capsule (20 mg total) by mouth daily. 10/10/15   April Palumbo, MD  ondansetron (ZOFRAN ODT) 8 MG disintegrating tablet Take 1 tablet (8 mg total) by mouth every 8 (eight) hours as needed for nausea or vomiting. 07/05/13   Margarita Grizzleanielle Ray, MD  sucralfate (CARAFATE) 1 GM/10ML suspension Take 10 mLs (1 g total) by mouth 4 (four) times daily -  with meals and at bedtime. 10/10/15   April Palumbo, MD   BP 156/96 mmHg  Pulse 62  Temp(Src) 98.5 F (36.9 C) (Oral)  Resp 18  Ht 5\' 10"  (1.778 m)  Wt 99.791 kg  BMI 31.57 kg/m2  SpO2 100% Physical Exam  Constitutional: He is oriented to person, place, and time. He appears well-developed and well-nourished.  Alert and in no acute distress  HENT:  Head: Normocephalic and atraumatic.  Cardiovascular: Normal rate, regular rhythm and normal heart sounds.   No murmur heard. Pulmonary/Chest: Effort normal and breath sounds normal. No respiratory distress.  Abdominal: Soft. Bowel sounds are normal. He exhibits no distension and no mass. There is no tenderness. There is no rebound and no guarding.  Genitourinary:     Chaperone present for exam. + right inguinal lymphadenopathy. No signs of any inguinal hernias. The penis and testicles  are nontender. No testicular masses or swelling. . No signs of any discharge from the penis. Cremaster reflex present bilaterally.   Neurological: He is alert and oriented to person, place, and time.  Skin: Skin is warm and dry.  Nursing note and vitals reviewed.   ED Course  Procedures (including critical care time) Labs Review Labs Reviewed  URINALYSIS, ROUTINE W REFLEX MICROSCOPIC (NOT AT [facility]) - Abnormal; Notable for the following:     Hgb urine dipstick MODERATE (*)    All other components within normal limits  URINE MICROSCOPIC-ADD ON - Abnormal; Notable for the following:    Squamous Epithelial / LPF 0-5 (*)    Bacteria, UA RARE (*)    All other components within normal limits  RPR  HIV ANTIBODY (ROUTINE TESTING)  GC/CHLAMYDIA PROBE AMP ([facility]) NOT AT [facility]    Imaging Review No results found. I have personally reviewed and evaluated these images and lab results as part of my medical decision-making.   EKG Interpretation None      MDM   Final diagnoses:  Lymphadenopathy  Penile lesion   Chai Routh presents to ED for right inguinal lymphadenopathy and penile lesion. On exam, lesion is nontender with no surrounding erythema. RPR, HIV, G&C sent. Patient informed he will be called if results are positive. Herpetic lesion still in ddx even though area is nontender. Discussed this with patient who states he would like to wait for all results before any treatment. Prophylactic ABX discussed which patient declined - he states that he has a PCP he can see easily and will follow up. Patient encouraged to follow up with PCP if tests come back negative and if results are positive.   [facility] Peighton Mehra, PA-C 10/30/15 1957  Alvira Monday, MD 10/31/15 1352

## 2015-10-30 NOTE — Discharge Instructions (Signed)
You have been tested for HIV, syphilis, chlamydia and gonorrhea. These results will be available in approximately 3 days. You will be notified if they are positive. If your results are negative and you do not get a phone call, it is important to follow up with your primary physician or the health department. Please return to the ER for worsening symptoms, high fevers or persistent vomiting.    It is very important to practice safe sex and use condoms when sexually active. If your results are positive you need to notify all sexual partners so they can be treated as well. The website https://garcia.net/http://www.dontspreadit.com/ can be used to send anonymous text messages or emails to alert sexual contacts.   SEEK IMMEDIATE MEDICAL CARE IF:  You develop an oral temperature above 102 F (38.9 C), not controlled by medications or lasting more than 2 days.  You develop an increase in pain.  You develop any type of abnormal discharge.

## 2015-10-30 NOTE — ED Notes (Signed)
Tenderness in his groin area.

## 2015-10-31 LAB — GC/CHLAMYDIA PROBE AMP (~~LOC~~) NOT AT ARMC
CHLAMYDIA, DNA PROBE: NEGATIVE
NEISSERIA GONORRHEA: NEGATIVE

## 2015-11-01 LAB — HIV ANTIBODY (ROUTINE TESTING W REFLEX): HIV SCREEN 4TH GENERATION: NONREACTIVE

## 2015-11-01 LAB — RPR: RPR: NONREACTIVE

## 2016-04-05 ENCOUNTER — Emergency Department (HOSPITAL_BASED_OUTPATIENT_CLINIC_OR_DEPARTMENT_OTHER): Payer: Self-pay

## 2016-04-05 ENCOUNTER — Emergency Department (HOSPITAL_BASED_OUTPATIENT_CLINIC_OR_DEPARTMENT_OTHER)
Admission: EM | Admit: 2016-04-05 | Discharge: 2016-04-05 | Disposition: A | Discharge status: Home / Self Care | Payer: Self-pay | Attending: Emergency Medicine | Admitting: Emergency Medicine

## 2016-04-05 ENCOUNTER — Encounter (HOSPITAL_BASED_OUTPATIENT_CLINIC_OR_DEPARTMENT_OTHER): Payer: Self-pay | Admitting: *Deleted

## 2016-04-05 DIAGNOSIS — J069 Acute upper respiratory infection, unspecified: Secondary | ICD-10-CM | POA: Insufficient documentation

## 2016-04-05 DIAGNOSIS — B9789 Other viral agents as the cause of diseases classified elsewhere: Secondary | ICD-10-CM

## 2016-04-05 DIAGNOSIS — Z7982 Long term (current) use of aspirin: Secondary | ICD-10-CM | POA: Insufficient documentation

## 2016-04-05 MED ORDER — BENZONATATE 100 MG PO CAPS: 100.0000 mg | ORAL_CAPSULE | Freq: Three times a day (TID) | ORAL | 0 refills | Status: DC

## 2016-04-05 MED ORDER — BENZONATATE 100 MG PO CAPS
100.0000 mg | ORAL_CAPSULE | Freq: Once | ORAL | Status: AC
  Administered 2016-04-05: 100 mg via ORAL
  Filled 2016-04-05: qty 1

## 2016-04-05 MED ORDER — ACETAMINOPHEN 500 MG PO TABS
1000.0000 mg | ORAL_TABLET | Freq: Once | ORAL | Status: AC
  Administered 2016-04-05: 1000 mg via ORAL
  Filled 2016-04-05: qty 2

## 2016-04-05 MED ORDER — IBUPROFEN 800 MG PO TABS
800.0000 mg | ORAL_TABLET | Freq: Once | ORAL | Status: AC
  Administered 2016-04-05: 800 mg via ORAL
  Filled 2016-04-05: qty 1

## 2016-04-05 NOTE — ED Triage Notes (Signed)
Patient states he has a one week history of generalized body aches, chills, sweats, and a productive cough with clear, yellow and greenish secretions.  Has been using otc meds with minimal relief.

## 2016-04-05 NOTE — Discharge Instructions (Signed)
Take tylenol 2 pills 4 times a day and motrin 4 pills 3 times a day.  Drink plenty of fluids.  Return for worsening shortness of breath, headache, confusion. Follow up with your family doctor.   

## 2016-04-05 NOTE — ED Provider Notes (Signed)
[facility] Provider Note   CSN: [id number] Arrival date & time: 04/05/16  [id number]     History   Chief Complaint Chief Complaint  Patient presents with  . Cough    HPI [name] is a 45 y.o. male.  45 yo M with a chief complaint of cough congestion myalgias. This been going on for about a week. He doesn't feel like he's gotten any better. Unsure of sick contacts. Denies shortness of breath chest pain. Denies facial pain. Denies otalgia.   The history is provided by the patient.  Illness  This is a new problem. The current episode started more than 1 week ago. The problem occurs constantly. The problem has not changed since onset.Pertinent negatives include no chest pain, no abdominal pain, no headaches and no shortness of breath. Nothing aggravates the symptoms. Nothing relieves the symptoms. He has tried nothing for the symptoms. The treatment provided no relief.    Past Medical History:  Diagnosis Date  . Blood clots in brain     There are no active problems to display for this patient.   Past Surgical History:  Procedure Laterality Date  . carpel tunnel         Home Medications    Prior to Admission medications   Medication Sig Start Date End Date Taking? Authorizing Provider  aspirin EC 81 MG tablet Take 81 mg by mouth daily.   Yes Historical Provider, MD  albuterol (PROVENTIL HFA;VENTOLIN HFA) 108 (90 BASE) MCG/ACT inhaler Inhale 1-2 puffs into the lungs every 6 (six) hours as needed for wheezing or shortness of breath. 09/05/13   Robyn M Hess, PA-C  azithromycin (ZITHROMAX) 250 MG tablet Take 250 mg by mouth daily.    Historical Provider, MD  benzonatate (TESSALON) 100 MG capsule Take 1 capsule (100 mg total) by mouth every 8 (eight) hours. 04/05/16   Melene Planan Tranell Wojtkiewicz, DO  chlorpheniramine-HYDROcodone (TUSSIONEX) 10-8 MG/5ML LQCR Take 5 mLs by mouth.    Historical Provider, MD  omeprazole (PRILOSEC) 20 MG capsule Take 1 capsule (20 mg total) by mouth  daily. 10/10/15   April Palumbo, MD  ondansetron (ZOFRAN ODT) 8 MG disintegrating tablet Take 1 tablet (8 mg total) by mouth every 8 (eight) hours as needed for nausea or vomiting. 07/05/13   Margarita Grizzleanielle Ray, MD  sucralfate (CARAFATE) 1 GM/10ML suspension Take 10 mLs (1 g total) by mouth 4 (four) times daily -  with meals and at bedtime. 10/10/15   April Palumbo, MD    Family History No family history on file.  Social History Social History  Substance Use Topics  . Smoking status: Never Smoker  . Smokeless tobacco: Never Used  . Alcohol use No     Allergies   Review of patient's allergies indicates no known allergies.   Review of Systems Review of Systems  Constitutional: Negative for chills and fever.  HENT: Negative for congestion and facial swelling.   Eyes: Negative for discharge and visual disturbance.  Respiratory: Positive for cough. Negative for shortness of breath.   Cardiovascular: Negative for chest pain and palpitations.  Gastrointestinal: Negative for abdominal pain, diarrhea and vomiting.  Musculoskeletal: Negative for arthralgias and myalgias.  Skin: Negative for color change and rash.  Neurological: Negative for tremors, syncope and headaches.  Psychiatric/Behavioral: Negative for confusion and dysphoric mood.     Physical Exam Updated Vital Signs BP 139/76 (BP Location: Right Arm)   Pulse 84   Temp 99.5 F (37.5 C) (Oral)   Resp 20  Ht 5\' 9"  (1.753 m)   Wt 202 lb (91.6 kg)   SpO2 100%   BMI 29.83 kg/m   Physical Exam  Constitutional: He is oriented to person, place, and time. He appears well-developed and well-nourished.  HENT:  Head: Normocephalic and atraumatic.  Swollen turbinates, posterior nasal drip, no noted sinus ttp, tm normal bilaterally.    Eyes: EOM are normal. Pupils are equal, round, and reactive to light.  Neck: Normal range of motion. Neck supple. No JVD present.  Cardiovascular: Normal rate and regular rhythm.  Exam reveals no gallop  and no friction rub.   No murmur heard. Pulmonary/Chest: No respiratory distress. He has no wheezes.  Abdominal: He exhibits no distension and no mass. There is no tenderness. There is no rebound and no guarding.  Musculoskeletal: Normal range of motion.  Neurological: He is alert and oriented to person, place, and time.  Skin: No rash noted. No pallor.  Psychiatric: He has a normal mood and affect. His behavior is normal.  Nursing note and vitals reviewed.    ED Treatments / Results  Labs (all labs ordered are listed, but only abnormal results are displayed) Labs Reviewed - No data to display  EKG  EKG Interpretation None       Radiology Dg Chest 2 View  Result Date: 04/05/2016 CLINICAL DATA:  Cough, congestion, chills, body aches for 1 week EXAM: CHEST  2 VIEW COMPARISON:  None. FINDINGS: The heart size and mediastinal contours are within normal limits. Both lungs are clear. The visualized skeletal structures are unremarkable. IMPRESSION: No active cardiopulmonary disease. Electronically Signed   By: Elige Ko   On: 04/05/2016 08:16    Procedures Procedures (including critical care time)  Medications Ordered in ED Medications  acetaminophen (TYLENOL) tablet 1,000 mg (1,000 mg Oral Given 04/05/16 0753)  ibuprofen (ADVIL,MOTRIN) tablet 800 mg (800 mg Oral Given 04/05/16 0753)  benzonatate (TESSALON) capsule 100 mg (100 mg Oral Given 04/05/16 0753)     Initial Impression / Assessment and Plan / ED Course  I have reviewed the triage vital signs and the nursing notes.  Pertinent labs & imaging results that were available during my care of the patient were reviewed by me and considered in my medical decision making (see chart for details).  Clinical Course    45 yo M With a URI. Going on for the past week or so. Will obtain a chest x-ray to rule out pneumonia. Likely viral in nature. Discharge home.  8:39 AM:  I have discussed the diagnosis/risks/treatment options  with the patient and believe the pt to be eligible for discharge home to follow-up with PCP. We also discussed returning to the ED immediately if new or worsening sx occur. We discussed the sx which are most concerning (e.g., sudden worsening pain, fever, inability to tolerate by mouth) that necessitate immediate return. Medications administered to the patient during their visit and any new prescriptions provided to the patient are listed below.  Medications given during this visit Medications  acetaminophen (TYLENOL) tablet 1,000 mg (1,000 mg Oral Given 04/05/16 0753)  ibuprofen (ADVIL,MOTRIN) tablet 800 mg (800 mg Oral Given 04/05/16 0753)  benzonatate (TESSALON) capsule 100 mg (100 mg Oral Given 04/05/16 0753)     The patient appears reasonably screen and/or stabilized for discharge and I doubt any other medical condition or other [facility] requiring further screening, evaluation, or treatment in the ED at this time prior to discharge.    Final Clinical Impressions(s) / ED Diagnoses  Final diagnoses:  Viral URI with cough    New Prescriptions New Prescriptions   BENZONATATE (TESSALON) 100 MG CAPSULE    Take 1 capsule (100 mg total) by mouth every 8 (eight) hours.     Melene Planan Jamilya Sarrazin, DO 04/05/16 [id number]

## 2017-06-16 ENCOUNTER — Other Ambulatory Visit: Payer: Self-pay

## 2017-06-16 ENCOUNTER — Encounter (HOSPITAL_BASED_OUTPATIENT_CLINIC_OR_DEPARTMENT_OTHER): Payer: Self-pay

## 2017-06-16 ENCOUNTER — Emergency Department (HOSPITAL_BASED_OUTPATIENT_CLINIC_OR_DEPARTMENT_OTHER)
Admission: EM | Admit: 2017-06-16 | Discharge: 2017-06-16 | Disposition: A | Discharge status: Home / Self Care | Payer: Managed Care, Other (non HMO) | Attending: Emergency Medicine | Admitting: Emergency Medicine

## 2017-06-16 DIAGNOSIS — Z7982 Long term (current) use of aspirin: Secondary | ICD-10-CM | POA: Insufficient documentation

## 2017-06-16 DIAGNOSIS — Z79899 Other long term (current) drug therapy: Secondary | ICD-10-CM | POA: Insufficient documentation

## 2017-06-16 DIAGNOSIS — N3001 Acute cystitis with hematuria: Secondary | ICD-10-CM | POA: Insufficient documentation

## 2017-06-16 LAB — URINALYSIS, ROUTINE W REFLEX MICROSCOPIC
BILIRUBIN URINE: NEGATIVE
Glucose, UA: NEGATIVE mg/dL
KETONES UR: NEGATIVE mg/dL
NITRITE: POSITIVE — AB
Protein, ur: 30 mg/dL — AB
Specific Gravity, Urine: 1.025 (ref 1.005–1.030)
pH: 6 (ref 5.0–8.0)

## 2017-06-16 LAB — URINALYSIS, MICROSCOPIC (REFLEX)

## 2017-06-16 MED ORDER — CIPROFLOXACIN HCL 500 MG PO TABS: 500.0000 mg | ORAL_TABLET | Freq: Two times a day (BID) | ORAL | 0 refills | Status: AC

## 2017-06-16 MED FILL — CIPROFLOXACIN HCL 500 MG TA: 500 | 7 days supply | Qty: 14 | Fill #0

## 2017-06-16 NOTE — ED Triage Notes (Signed)
C/o body aches, chills x 4 days-denies resp and GI c/o-states urine does have an odor-NAD-steady gait

## 2017-06-16 NOTE — ED Provider Notes (Signed)
[facility] EMERGENCY DEPARTMENT Provider Note   CSN: [id number] Arrival date & time: 06/16/17  1517     History   Chief Complaint Chief Complaint  Patient presents with  . Generalized Body Aches    HPI [name] is a 47 y.o. male who presents for evaluation of 4 days of generalized body aches, chills.  Patient also reports that yesterday he had a fever of 101.0.  He has been taking Advil and over-the-counter cold medication for symptomatic relief.  Patient reports that he has slight fever today but states that is improved since this morning.  Patient feels generalized fatigue.  He has been eating and drinking appropriately.  He denies any nausea or vomiting.  Patient had one episode of loose stool today.  No blood but otherwise no diarrhea.  Patient also reports that he feels like his urine has had a strong odor to it.  Patient denies any cough, nasal congestion, rhinorrhea, chest pain, difficulty breathing, abdominal pain, dysuria, hematuria, testicular pain or swelling, penile pain penile discharge.   He denies any sick contacts.  Patient reports he is currently sexually active with 2 partners.  He does not know their STD history.  He penile discharge, testicular pain, penile pain, perineal pain.  The history is provided by the patient.    Past Medical History:  Diagnosis Date  . Blood clots in brain     There are no active problems to display for this patient.   Past Surgical History:  Procedure Laterality Date  . CARPAL TUNNEL RELEASE    . carpel tunnel         Home Medications    Prior to Admission medications   Medication Sig Start Date End Date Taking? Authorizing Provider  albuterol (PROVENTIL HFA;VENTOLIN HFA) 108 (90 BASE) MCG/ACT inhaler Inhale 1-2 puffs into the lungs every 6 (six) hours as needed for wheezing or shortness of breath. 09/05/13   Hess, Nada Boozer, PA-C  aspirin EC 81 MG tablet Take 81 mg by mouth daily.    [provider]    azithromycin (ZITHROMAX) 250 MG tablet Take 250 mg by mouth daily.    [provider]  benzonatate (TESSALON) 100 MG capsule Take 1 capsule (100 mg total) by mouth every 8 (eight) hours. 04/05/16   Melene Plan, DO  chlorpheniramine-HYDROcodone (TUSSIONEX) 10-8 MG/5ML LQCR Take 5 mLs by mouth.    [provider]  ciprofloxacin (CIPRO) 500 MG tablet Take 1 tablet (500 mg total) by mouth every 12 (twelve) hours for 7 days. 06/16/17 06/23/17  Maxwell Caul, PA-C  omeprazole (PRILOSEC) 20 MG capsule Take 1 capsule (20 mg total) by mouth daily. 10/10/15   Palumbo, April, MD  ondansetron (ZOFRAN ODT) 8 MG disintegrating tablet Take 1 tablet (8 mg total) by mouth every 8 (eight) hours as needed for nausea or vomiting. 07/05/13   Margarita Grizzle, MD  sucralfate (CARAFATE) 1 GM/10ML suspension Take 10 mLs (1 g total) by mouth 4 (four) times daily -  with meals and at bedtime. 10/10/15   Palumbo, April, MD    Family History No family history on file.  Social History Social History   Tobacco Use  . Smoking status: Never Smoker  . Smokeless tobacco: Never Used  Substance Use Topics  . Alcohol use: Yes    Frequency: Never    Comment: occ  . Drug use: No     Allergies   Patient has no known allergies.   Review of Systems Review  of Systems  Constitutional: Positive for chills, fatigue and fever.  Respiratory: Negative for cough and shortness of breath.   Cardiovascular: Negative for chest pain.  Gastrointestinal: Negative for abdominal pain, nausea and vomiting.  Genitourinary: Negative for dysuria and hematuria.  Neurological: Negative for headaches.     Physical Exam Updated Vital Signs BP 134/83 (BP Location: Right Arm)   Pulse 76   Temp 98.5 F (36.9 C) (Oral)   Resp 16   Ht 5\' 10"  (1.778 m)   Wt 88.5 kg (195 lb)   SpO2 98%   BMI 27.98 kg/m   Physical Exam  Constitutional: He is oriented to person, place, and time. He appears well-developed and well-nourished.   Sitting comfortably on examination table  HENT:  Head: Normocephalic and atraumatic.  Mouth/Throat: Oropharynx is clear and moist and mucous membranes are normal.  Eyes: Conjunctivae, EOM and lids are normal. Pupils are equal, round, and reactive to light.  Neck: Full passive range of motion without pain.  Cardiovascular: Normal rate, regular rhythm, normal heart sounds and normal pulses. Exam reveals no gallop and no friction rub.  No murmur heard. Pulmonary/Chest: Effort normal and breath sounds normal.  Abdominal: Soft. Normal appearance. There is no tenderness. There is no rigidity, no guarding and no CVA tenderness. Hernia confirmed negative in the right inguinal area and confirmed negative in the left inguinal area.  Genitourinary: Testes normal and penis normal. Right testis shows no swelling and no tenderness. Left testis shows no swelling and no tenderness. Circumcised.  Genitourinary Comments: The exam was performed with a chaperone present. Normal male genitalia. No evidence of rash, ulcers or lesions.   Musculoskeletal: Normal range of motion.  Neurological: He is alert and oriented to person, place, and time.  Skin: Skin is warm and dry. Capillary refill takes less than 2 seconds.  Psychiatric: He has a normal mood and affect. His speech is normal.  Nursing note and vitals reviewed.    ED Treatments / Results  Labs (all labs ordered are listed, but only abnormal results are displayed) Labs Reviewed  URINALYSIS, ROUTINE W REFLEX MICROSCOPIC - Abnormal; Notable for the following components:      Result Value   APPearance CLOUDY (*)    Hgb urine dipstick LARGE (*)    Protein, ur 30 (*)    Nitrite POSITIVE (*)    Leukocytes, UA MODERATE (*)    All other components within normal limits  URINALYSIS, MICROSCOPIC (REFLEX) - Abnormal; Notable for the following components:   Bacteria, UA MANY (*)    Squamous Epithelial / LPF 0-5 (*)    All other components within normal limits   GC/CHLAMYDIA PROBE AMP ([place]) NOT [facility]    EKG  EKG Interpretation None       Radiology No results found.  Procedures Procedures (including critical care time)  Medications Ordered in ED Medications - No data to display   Initial Impression / Assessment and Plan / ED Course  I have reviewed the triage vital signs and the nursing notes.  Pertinent labs & imaging results that were available during my care of the patient were reviewed by me and considered in my medical decision making (see chart for details).     47 y.o.  male who presents for evaluation of 4 days of generalized body aches, chills.  Also reports fever that began yesterday. Fever resolved this morning. Patient is afebrile, non-toxic appearing, sitting comfortably on examination table. Vital signs reviewed and stable.  Patient  is slightly hypertensive.  He denies any history of hypertension he does have a primary care doctor that he follows up with.  Benign abdominal exam.  Normal GU exam.  Consider UTI.  Also consider STD given history/physical exam.  History/physical exam not concerning for kidney stone, pyelonephritis, prostatitis.  UA ordered at triage.  UA reviewed.  Patient is positive for nitrates, leukocytes, pyuria.  He does have hemoglobin.  Patient's exam, do not feel that CT imaging is required at this time as I do not believe patient's symptoms are caused by kidney stone.  Discussed results with patient.  He does not wish to be treated for gonorrhea or chlamydia at this time.  He would rather wait for the results to come back.  Discussed with him regarding obtaining from sexual intercourse until results are back and he completes therapy. Patient with no known drug allergies.  Patient also instructed to follow-up with primary care doctor regarding high blood pressure. Patient had ample opportunity for questions and discussion. All patient's questions were answered with full understanding. Strict return  precautions discussed. Patient expresses understanding and agreement to plan.   Final Clinical Impressions(s) / ED Diagnoses   Final diagnoses:  Acute cystitis with hematuria    ED Discharge Orders        Ordered    ciprofloxacin (CIPRO) 500 MG tablet  Every 12 hours     06/16/17 1657       Maxwell Caul, PA-C 06/16/17 1714    Nira Conn, MD 06/18/17 [phone]

## 2017-06-16 NOTE — ED Notes (Signed)
Pt verbalizes understanding of d/c instructions and denies any further needs at this time. 

## 2017-06-16 NOTE — ED Notes (Signed)
ED Provider at bedside. 

## 2017-06-16 NOTE — Discharge Instructions (Signed)
Take antibiotics as directed. Please take all of your antibiotics until finished.  You can take Tylenol or Ibuprofen as directed for pain. You can alternate Tylenol and Ibuprofen every 4 hours. If you take Tylenol at 1pm, then you can take Ibuprofen at 5pm. Then you can take Tylenol again at 9pm.   As we discussed, the Gonorrhea and Chlamydia results will take a few days to come back. If they are positive, you will be notified and directed at appropriate therapies. Do not have sexual intercourse until you have gotten the results and completed treatment.   Return to the Emergency Department for any fever, pain, vomiting, penile pain or swelling, testicular pain or swelling or any other worsening concernins.

## 2017-08-07 ENCOUNTER — Emergency Department (HOSPITAL_BASED_OUTPATIENT_CLINIC_OR_DEPARTMENT_OTHER): Payer: Worker's Compensation

## 2017-08-07 ENCOUNTER — Other Ambulatory Visit: Payer: Self-pay

## 2017-08-07 ENCOUNTER — Encounter (HOSPITAL_BASED_OUTPATIENT_CLINIC_OR_DEPARTMENT_OTHER): Payer: Self-pay | Admitting: Emergency Medicine

## 2017-08-07 ENCOUNTER — Emergency Department (HOSPITAL_BASED_OUTPATIENT_CLINIC_OR_DEPARTMENT_OTHER)
Admission: EM | Admit: 2017-08-07 | Discharge: 2017-08-07 | Disposition: A | Discharge status: Home / Self Care | Payer: Worker's Compensation | Attending: Emergency Medicine | Admitting: Emergency Medicine

## 2017-08-07 DIAGNOSIS — Y9389 Activity, other specified: Secondary | ICD-10-CM | POA: Insufficient documentation

## 2017-08-07 DIAGNOSIS — Z23 Encounter for immunization: Secondary | ICD-10-CM | POA: Insufficient documentation

## 2017-08-07 DIAGNOSIS — W540XXA Bitten by dog, initial encounter: Secondary | ICD-10-CM | POA: Diagnosis not present

## 2017-08-07 DIAGNOSIS — S41152A Open bite of left upper arm, initial encounter: Secondary | ICD-10-CM | POA: Diagnosis not present

## 2017-08-07 DIAGNOSIS — Z79899 Other long term (current) drug therapy: Secondary | ICD-10-CM | POA: Insufficient documentation

## 2017-08-07 DIAGNOSIS — Z7982 Long term (current) use of aspirin: Secondary | ICD-10-CM | POA: Insufficient documentation

## 2017-08-07 DIAGNOSIS — Y929 Unspecified place or not applicable: Secondary | ICD-10-CM | POA: Insufficient documentation

## 2017-08-07 DIAGNOSIS — S81852A Open bite, left lower leg, initial encounter: Secondary | ICD-10-CM | POA: Diagnosis not present

## 2017-08-07 DIAGNOSIS — S8992XA Unspecified injury of left lower leg, initial encounter: Secondary | ICD-10-CM | POA: Diagnosis present

## 2017-08-07 DIAGNOSIS — Y998 Other external cause status: Secondary | ICD-10-CM | POA: Diagnosis not present

## 2017-08-07 MED ORDER — TETANUS-DIPHTH-ACELL PERTUSSIS 5-2.5-18.5 LF-MCG/0.5 IM SUSP
0.5000 mL | Freq: Once | INTRAMUSCULAR | Status: AC
  Administered 2017-08-07: 0.5 mL via INTRAMUSCULAR
  Filled 2017-08-07: qty 0.5

## 2017-08-07 MED ORDER — HYDROCODONE-ACETAMINOPHEN 5-325 MG PO TABS: ORAL_TABLET | ORAL | 0 refills | Status: DC

## 2017-08-07 MED ORDER — AMOXICILLIN-POT CLAVULANATE 875-125 MG PO TABS: 1.0000 | ORAL_TABLET | Freq: Two times a day (BID) | ORAL | 0 refills | Status: DC

## 2017-08-07 MED ORDER — ACETAMINOPHEN 325 MG PO TABS
650.0000 mg | ORAL_TABLET | Freq: Once | ORAL | Status: AC
Start: 2017-08-07 — End: 2017-08-07
  Administered 2017-08-07: 650 mg via ORAL
  Filled 2017-08-07: qty 2

## 2017-08-07 MED ORDER — AMOXICILLIN-POT CLAVULANATE 875-125 MG PO TABS
1.0000 | ORAL_TABLET | Freq: Once | ORAL | Status: AC
  Administered 2017-08-07: 1 via ORAL
  Filled 2017-08-07: qty 1

## 2017-08-07 MED ORDER — BACITRACIN ZINC 500 UNIT/GM EX OINT: 1.0000 "application " | TOPICAL_OINTMENT | Freq: Once | CUTANEOUS | Status: DC

## 2017-08-07 NOTE — ED Triage Notes (Signed)
Patient reports bitten by dog prior to arrival to ER.  States that owner states dog is UTD on vaccinations.  Injury to left forearm and left leg.

## 2017-08-07 NOTE — ED Notes (Signed)
Presents with multiple dog bites to left leg, left forearm and left arm. Bleeding controlled.

## 2017-08-07 NOTE — ED Provider Notes (Signed)
[facility] EMERGENCY DEPARTMENT Provider Note   CSN: [id number] Arrival date & time: 08/07/17  1316     History   Chief Complaint Chief Complaint  Patient presents with  . Animal Bite   HPI   Blood pressure (!) 153/98, pulse (!) 107, temperature 98.3 F (36.8 C), temperature source Oral, resp. rate 20, height 5\' 10"  (1.778 m), weight 88.9 kg (196 lb), SpO2 100 %.  [name] is a 47 y.o. male complaining of dog bite to left upper and lower extremity.  Patient was lifting packages to a house, the owner open the door the dog pushed through the storm door and proceeded to bite him.  He is unsure when his last tetanus shot was.  The owner states that the dogs are up-to-date on her vaccinations. Pain is moderate.   Past Medical History:  Diagnosis Date  . Blood clots in brain     There are no active problems to display for this patient.   Past Surgical History:  Procedure Laterality Date  . CARPAL TUNNEL RELEASE    . carpel tunnel         Home Medications    Prior to Admission medications   Medication Sig Start Date End Date Taking? Authorizing Provider  albuterol (PROVENTIL HFA;VENTOLIN HFA) 108 (90 BASE) MCG/ACT inhaler Inhale 1-2 puffs into the lungs every 6 (six) hours as needed for wheezing or shortness of breath. 09/05/13   Hess, Nada Boozer, PA-C  amoxicillin-clavulanate (AUGMENTIN) 875-125 MG tablet Take 1 tablet by mouth 2 (two) times daily. One tab po bid x 10 days 08/07/17   [place] Kuang, Mardella Layman  aspirin EC 81 MG tablet Take 81 mg by mouth daily.    [provider]  azithromycin (ZITHROMAX) 250 MG tablet Take 250 mg by mouth daily.    [provider]  benzonatate (TESSALON) 100 MG capsule Take 1 capsule (100 mg total) by mouth every 8 (eight) hours. 04/05/16   Melene Plan, DO  chlorpheniramine-HYDROcodone (TUSSIONEX) 10-8 MG/5ML LQCR Take 5 mLs by mouth.    [provider]  HYDROcodone-acetaminophen (NORCO/VICODIN) 5-325 MG  tablet Take 1-2 tablets by mouth every 6 hours as needed for pain. 08/07/17   Jhoanna Heyde, Joni Reining, PA-C  omeprazole (PRILOSEC) 20 MG capsule Take 1 capsule (20 mg total) by mouth daily. 10/10/15   Palumbo, April, MD  ondansetron (ZOFRAN ODT) 8 MG disintegrating tablet Take 1 tablet (8 mg total) by mouth every 8 (eight) hours as needed for nausea or vomiting. 07/05/13   Margarita Grizzle, MD  sucralfate (CARAFATE) 1 GM/10ML suspension Take 10 mLs (1 g total) by mouth 4 (four) times daily -  with meals and at bedtime. 10/10/15   Palumbo, April, MD    Family History History reviewed. No pertinent family history.  Social History Social History   Tobacco Use  . Smoking status: Never Smoker  . Smokeless tobacco: Never Used  Substance Use Topics  . Alcohol use: Yes    Frequency: Never    Comment: occ  . Drug use: No     Allergies   Patient has no known allergies.   Review of Systems Review of Systems  A complete review of systems was obtained and all systems are negative except as noted in the HPI and PMH.   Physical Exam Updated Vital Signs BP (!) 162/99 (BP Location: Left Arm)   Pulse 84   Temp 98.3 F (36.8 C) (Oral)   Resp 18   Ht 5\' 10"  (1.778 m)  Wt 88.9 kg (196 lb)   SpO2 100%   BMI 28.12 kg/m   Physical Exam  Constitutional: He is oriented to person, place, and time. He appears well-developed and well-nourished. No distress.  HENT:  Head: Normocephalic and atraumatic.  Mouth/Throat: Oropharynx is clear and moist.  Eyes: Conjunctivae and EOM are normal. Pupils are equal, round, and reactive to light.  Neck: Normal range of motion.  Cardiovascular: Normal rate, regular rhythm and intact distal pulses.  Pulmonary/Chest: Effort normal and breath sounds normal.  Abdominal: Soft. There is no tenderness.  Musculoskeletal: Normal range of motion.  Neurological: He is alert and oriented to person, place, and time.  Skin: He is not diaphoretic.  Multiple bite wounds as  photographed specifically patient has deep full-thickness laceration on his left hand and left leg, no gross contamination, patient is distally neurovascular intact with full active range of motion.  Psychiatric: He has a normal mood and affect.  Nursing note and vitals reviewed.              ED Treatments / Results  Labs (all labs ordered are listed, but only abnormal results are displayed) Labs Reviewed - No data to display  EKG  EKG Interpretation None       Radiology Dg Forearm Left  Result Date: 08/07/2017 CLINICAL DATA:  Dog bite. EXAM: LEFT FOREARM - 2 VIEW COMPARISON:  None. FINDINGS: Soft tissue wounds are present along the dorsal and medial aspects of the left forearm. There is no radiopaque foreign body. No underlying fracture is present. IMPRESSION: Soft tissue wounds along the dorsomedial aspect of the proximal forearm without underlying fracture or radiopaque foreign body. Electronically Signed   By: Marin Roberts M.D.   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 Multiple bites to the distal tib/fib and multiple bites to forearm and distal humerus. EXAM: LEFT TIBIA AND FIBULA - 2 VIEW COMPARISON:  None. FINDINGS: No fracture of the tibia or fibula. Knee joint and ankle joint appear normal on two views. Soft tissue defect within the posterior lower extremities seen on lateral projection, no foreign body IMPRESSION: No fracture or foreign body. Electronically Signed   By: Genevive Bi M.D.   On: 08/07/2017 16:26    Procedures Procedures (including critical care time)  Medications Ordered in ED Medications  bacitracin ointment 1 application (not administered)  acetaminophen (TYLENOL) tablet 650 mg (650 mg Oral Given 08/07/17 1600)  Tdap (BOOSTRIX) injection 0.5 mL (0.5 mLs Intramuscular Given 08/07/17 1601)  amoxicillin-clavulanate  (AUGMENTIN) 875-125 MG per tablet 1 tablet (1 tablet Oral Given 08/07/17 1600)     Initial Impression / Assessment and Plan / ED Course  I have reviewed the triage vital signs and the nursing notes.  Pertinent labs & imaging results that were available during my care of the patient were reviewed by me and considered in my medical decision making (see chart for details).     Vitals:   08/07/17 1327 08/07/17 1328 08/07/17 1757  BP: (!) 153/98  (!) 162/99  Pulse: (!) 107  84  Resp: 20  18  Temp: 98.3 F (36.8 C)    TempSrc: Oral    SpO2: 100%  100%  Weight:  88.9 kg (196 lb)   Height:  5\' 10"  (1.778 m)     Medications  bacitracin ointment 1 application (not administered)  acetaminophen (TYLENOL)  tablet 650 mg (650 mg Oral Given 08/07/17 1600)  Tdap (BOOSTRIX) injection 0.5 mL (0.5 mLs Intramuscular Given 08/07/17 1601)  amoxicillin-clavulanate (AUGMENTIN) 875-125 MG per tablet 1 tablet (1 tablet Oral Given 08/07/17 1600)    [name] is 47 y.o. male presenting with multiple puncture wounds and lacerations status post dog bite.  Tetanus shot is updated, wounds are irrigated and loosely closed with Steri-Strips.  Patient started on Augmentin.  Animal control has confirmed that this dog is up-to-date on his vaccinations.  Evaluation does not show pathology that would require ongoing emergent intervention or inpatient treatment. Pt is hemodynamically stable and mentating appropriately. Discussed findings and plan with patient/guardian, who agrees with care plan. All questions answered. Return precautions discussed and outpatient follow up given.    Final Clinical Impressions(s) / ED Diagnoses   Final diagnoses:  Dog bite, initial encounter    ED Discharge Orders        Ordered    HYDROcodone-acetaminophen (NORCO/VICODIN) 5-325 MG tablet  Status:  Discontinued     08/07/17 1811    amoxicillin-clavulanate (AUGMENTIN) 875-125 MG tablet  2 times daily     08/07/17 1815     HYDROcodone-acetaminophen (NORCO/VICODIN) 5-325 MG tablet     08/07/17 1815       Lynnell Fiumara, Mardella Laymanicole, PA-C 08/07/17 2104    Tilden Fossaees, Elizabeth, MD 08/08/17 1555

## 2017-08-07 NOTE — Discharge Instructions (Signed)
Keep wound dry and do not remove dressing for 24 hours if possible. After that, wash gently morning and night (every 12 hours) with soap and water. Use a topical antibiotic ointment and cover with a bandaid or gauze.    Do NOT use rubbing alcohol or hydrogen peroxide, do not soak the area   Present to your primary care doctor or the urgent care of your choice, or the ED for suture removal in 7-10 days.   Every attempt was made to remove foreign body (contaminants) from the wound.  However, there is always a chance that some may remain in the wound. This can  increase your risk of infection.   If you see signs of infection (warmth, redness, tenderness, pus, sharp increase in pain, fever, red streaking in the skin) immediately return to the emergency department.   After the wound heals fully, apply sunscreen for 6-12 months to minimize scarring.   Please follow with your primary care doctor in the next 2 days for a check-up. They must obtain records for further management.   Do not hesitate to return to the Emergency Department for any new, worsening or concerning symptoms.

## 2017-08-09 ENCOUNTER — Other Ambulatory Visit: Payer: Self-pay

## 2017-08-09 ENCOUNTER — Encounter (HOSPITAL_BASED_OUTPATIENT_CLINIC_OR_DEPARTMENT_OTHER): Payer: Self-pay | Admitting: Adult Health

## 2017-08-09 ENCOUNTER — Emergency Department (HOSPITAL_BASED_OUTPATIENT_CLINIC_OR_DEPARTMENT_OTHER)
Admission: EM | Admit: 2017-08-09 | Discharge: 2017-08-09 | Disposition: A | Discharge status: Home / Self Care | Payer: Managed Care, Other (non HMO) | Attending: Emergency Medicine | Admitting: Emergency Medicine

## 2017-08-09 DIAGNOSIS — Z79899 Other long term (current) drug therapy: Secondary | ICD-10-CM | POA: Insufficient documentation

## 2017-08-09 DIAGNOSIS — W540XXD Bitten by dog, subsequent encounter: Secondary | ICD-10-CM | POA: Insufficient documentation

## 2017-08-09 DIAGNOSIS — Z7982 Long term (current) use of aspirin: Secondary | ICD-10-CM | POA: Insufficient documentation

## 2017-08-09 DIAGNOSIS — Z5189 Encounter for other specified aftercare: Secondary | ICD-10-CM

## 2017-08-09 DIAGNOSIS — Z4800 Encounter for change or removal of nonsurgical wound dressing: Secondary | ICD-10-CM | POA: Insufficient documentation

## 2017-08-09 NOTE — Discharge Instructions (Addendum)
It was my pleasure taking care of you today!   Continue to keep your wounds clean and dry.  Continue to wash with soap water.  Continue your antibiotic until it is finished.  Return to ER for new or worsening symptoms, any additional concerns.

## 2017-08-09 NOTE — ED Triage Notes (Signed)
Pt HERE FOR F/U WOUND CHECK FROM [facility]IDAY. hE DENIES ANY REDNESS, SWELLING OR INCREASED PAIN.

## 2017-08-09 NOTE — ED Provider Notes (Signed)
[facility] EMERGENCY DEPARTMENT Provider Note   CSN: [id number] Arrival date & time: 08/09/17  2117     History   Chief Complaint Chief Complaint  Patient presents with  . Wound Check    HPI [name] is a 47 y.o. male.  The history is provided by the patient and medical records. No language interpreter was used.  Wound Check    [name] is a 47 y.o. male who presents to ER for wound check.  Seen in emergency department on 3/01 after sustaining multiple puncture wounds and lacerations from a dog bite.  His tetanus was updated and wounds were irrigated and loosely closed with Steri-Strips.  He was started on Augmentin.  He reports taking medication as directed without any missed doses.  He has been cleaning wounds daily and feels as if his wounds are healing appropriately, however would like someone to check to make sure they are healing well.  He denies any fevers or chills.  Denies any redness around the site.  No purulent drainage.  Past Medical History:  Diagnosis Date  . Blood clots in brain     There are no active problems to display for this patient.   Past Surgical History:  Procedure Laterality Date  . CARPAL TUNNEL RELEASE    . carpel tunnel         Home Medications    Prior to Admission medications   Medication Sig Start Date End Date Taking? Authorizing Provider  albuterol (PROVENTIL HFA;VENTOLIN HFA) 108 (90 BASE) MCG/ACT inhaler Inhale 1-2 puffs into the lungs every 6 (six) hours as needed for wheezing or shortness of breath. 09/05/13   Hess, Nada Boozerobyn M, PA-C  amoxicillin-clavulanate (AUGMENTIN) 875-125 MG tablet Take 1 tablet by mouth 2 (two) times daily. One tab po bid x 10 days 08/07/17   Pisciotta, Mardella Laymanicole, PA-C  aspirin EC 81 MG tablet Take 81 mg by mouth daily.    [provider]  azithromycin (ZITHROMAX) 250 MG tablet Take 250 mg by mouth daily.    [provider]  benzonatate (TESSALON) 100 MG capsule Take 1 capsule  (100 mg total) by mouth every 8 (eight) hours. 04/05/16   Melene PlanFloyd, Dan, DO  chlorpheniramine-HYDROcodone (TUSSIONEX) 10-8 MG/5ML LQCR Take 5 mLs by mouth.    [provider]  HYDROcodone-acetaminophen (NORCO/VICODIN) 5-325 MG tablet Take 1-2 tablets by mouth every 6 hours as needed for pain. 08/07/17   Pisciotta, Joni ReiningNicole, PA-C  omeprazole (PRILOSEC) 20 MG capsule Take 1 capsule (20 mg total) by mouth daily. 10/10/15   Palumbo, April, MD  ondansetron (ZOFRAN ODT) 8 MG disintegrating tablet Take 1 tablet (8 mg total) by mouth every 8 (eight) hours as needed for nausea or vomiting. 07/05/13   Margarita Grizzleay, Danielle, MD  sucralfate (CARAFATE) 1 GM/10ML suspension Take 10 mLs (1 g total) by mouth 4 (four) times daily -  with meals and at bedtime. 10/10/15   Palumbo, April, MD    Family History History reviewed. No pertinent family history.  Social History Social History   Tobacco Use  . Smoking status: Never Smoker  . Smokeless tobacco: Never Used  Substance Use Topics  . Alcohol use: Yes    Frequency: Never    Comment: occ  . Drug use: No     Allergies   Patient has no known allergies.   Review of Systems Review of Systems  Skin: Positive for wound.     Physical Exam Updated Vital Signs BP (!) 146/83 (BP Location: Right  Arm)   Pulse 66   Temp 98.4 F (36.9 C) (Oral)   Resp 18   SpO2 100%   Physical Exam  Constitutional: He appears well-developed and well-nourished. No distress.  HENT:  Head: Normocephalic and atraumatic.  Neck: Neck supple.  Cardiovascular: Normal rate, regular rhythm and normal heart sounds.  No murmur heard. Pulmonary/Chest: Effort normal and breath sounds normal. No respiratory distress. He has no wheezes. He has no rales.  Musculoskeletal: Normal range of motion.  Neurological: He is alert.  Skin: Skin is warm and dry.  Multiple puncture wounds and lacerations to right upper and lower extremities. No surrounding erythema or discharge.  Nursing note and  vitals reviewed.    ED Treatments / Results  Labs (all labs ordered are listed, but only abnormal results are displayed) Labs Reviewed - No data to display  EKG  EKG Interpretation None       Radiology No results found.  Procedures Procedures (including critical care time)  Medications Ordered in ED Medications - No data to display   Initial Impression / Assessment and Plan / ED Course  I have reviewed the triage vital signs and the nursing notes.  Pertinent labs & imaging results that were available during my care of the patient were reviewed by me and considered in my medical decision making (see chart for details).    [name] is a 47 y.o. male who presents to ED for wound check.  He was seen on 3/014 dog bite and sustained multiple lacerations and puncture wounds to the left upper and lower extremity.  These wounds were evaluated and healing well.  They were again cleaned and dressed in the ER today.  Continue Augmentin and home wound care.  Reasons to return to ER discussed.  All questions answered.    Final Clinical Impressions(s) / ED Diagnoses   Final diagnoses:  Visit for wound check    ED Discharge Orders    None       [place]ashmere Daywalt, Chase Picket, PA-C 08/09/17 2213    Tilden Fossa, MD 08/10/17 [phone]

## 2017-08-09 NOTE — ED Notes (Signed)
Pt attempted signature for discharge papers. Signature pad not working.

## 2018-03-11 ENCOUNTER — Emergency Department (HOSPITAL_BASED_OUTPATIENT_CLINIC_OR_DEPARTMENT_OTHER)
Admission: EM | Admit: 2018-03-11 | Discharge: 2018-03-11 | Disposition: A | Discharge status: Home / Self Care | Payer: Managed Care, Other (non HMO) | Attending: Emergency Medicine | Admitting: Emergency Medicine

## 2018-03-11 ENCOUNTER — Encounter (HOSPITAL_BASED_OUTPATIENT_CLINIC_OR_DEPARTMENT_OTHER): Payer: Self-pay | Admitting: Emergency Medicine

## 2018-03-11 ENCOUNTER — Other Ambulatory Visit: Payer: Self-pay

## 2018-03-11 DIAGNOSIS — Z7982 Long term (current) use of aspirin: Secondary | ICD-10-CM | POA: Insufficient documentation

## 2018-03-11 DIAGNOSIS — R05 Cough: Secondary | ICD-10-CM | POA: Insufficient documentation

## 2018-03-11 DIAGNOSIS — Z5321 Procedure and treatment not carried out due to patient leaving prior to being seen by health care provider: Secondary | ICD-10-CM | POA: Insufficient documentation

## 2018-03-11 DIAGNOSIS — Z79899 Other long term (current) drug therapy: Secondary | ICD-10-CM | POA: Insufficient documentation

## 2018-03-11 NOTE — ED Triage Notes (Signed)
Pt c/o cold symptoms for the past 3 days not getting better with OTC medication, no pain, no SOB NAD noticed.

## 2018-03-11 NOTE — ED Triage Notes (Signed)
Pt c/o cold symptoms for the past 4 days with persistent dry cough not getting better with OTC medication. Pt denies any pain or SOB

## 2018-03-12 ENCOUNTER — Emergency Department (HOSPITAL_BASED_OUTPATIENT_CLINIC_OR_DEPARTMENT_OTHER)
Admission: EM | Admit: 2018-03-12 | Discharge: 2018-03-12 | Disposition: A | Discharge status: Home / Self Care | Payer: Self-pay | Attending: Emergency Medicine | Admitting: Emergency Medicine

## 2018-03-12 ENCOUNTER — Emergency Department (HOSPITAL_BASED_OUTPATIENT_CLINIC_OR_DEPARTMENT_OTHER): Payer: Self-pay

## 2018-03-12 DIAGNOSIS — R059 Cough, unspecified: Secondary | ICD-10-CM

## 2018-03-12 DIAGNOSIS — R05 Cough: Secondary | ICD-10-CM

## 2018-03-12 MED ORDER — IPRATROPIUM-ALBUTEROL 0.5-2.5 (3) MG/3ML IN SOLN
3.0000 mL | Freq: Once | RESPIRATORY_TRACT | Status: AC
  Administered 2018-03-12: 3 mL via RESPIRATORY_TRACT
  Filled 2018-03-12: qty 3

## 2018-03-12 MED ORDER — ALBUTEROL SULFATE HFA 108 (90 BASE) MCG/ACT IN AERS: 2.0000 | INHALATION_SPRAY | Freq: Four times a day (QID) | RESPIRATORY_TRACT | 0 refills | Status: DC | PRN

## 2018-03-12 MED ORDER — PREDNISONE 50 MG PO TABS
60.0000 mg | ORAL_TABLET | Freq: Once | ORAL | Status: AC
  Administered 2018-03-12: 60 mg via ORAL
  Filled 2018-03-12: qty 1

## 2018-03-12 MED ORDER — PREDNISONE 20 MG PO TABS: 60.0000 mg | ORAL_TABLET | Freq: Every day | ORAL | 0 refills | Status: DC

## 2018-03-12 NOTE — Discharge Instructions (Addendum)
Return if you start running a fever, or if symptoms are not being controlled adequately at home.

## 2018-03-12 NOTE — ED Notes (Signed)
Dry hacking cough for 3 days. Able to speak full sentences.

## 2018-03-12 NOTE — ED Provider Notes (Signed)
[facility] EMERGENCY DEPARTMENT Provider Note   CSN: [id number] Arrival date & time: 03/11/18  2152     History   Chief Complaint Chief Complaint  Patient presents with  . Cough    HPI [name] is a 47 y.o. male.  The history is provided by the patient.  He has a history of intracranial venous sinus thrombosis, and comes in with a 5-day history of cough productive of small amount of yellow sputum.  There has been associated dyspnea.  He has been waking up at night with a sense that he cannot catch his breath.  He has tried a variety of over-the-counter measures including cough drops, antacids, without any benefit.  He denies fever or chills.  He denies rhinorrhea, sore throat, nasal congestion.  He denies chest pain, heaviness, tightness, pressure.  He denies nausea or vomiting.  Denies arthralgias or myalgias.  He denies any sick contacts.  He is a non-smoker.  Past Medical History:  Diagnosis Date  . Blood clots in brain     There are no active problems to display for this patient.   Past Surgical History:  Procedure Laterality Date  . CARPAL TUNNEL RELEASE    . carpel tunnel          Home Medications    Prior to Admission medications   Medication Sig Start Date End Date Taking? Authorizing Provider  albuterol (PROVENTIL HFA;VENTOLIN HFA) 108 (90 BASE) MCG/ACT inhaler Inhale 1-2 puffs into the lungs every 6 (six) hours as needed for wheezing or shortness of breath. 09/05/13   Hess, Nada Boozer, PA-C  amoxicillin-clavulanate (AUGMENTIN) 875-125 MG tablet Take 1 tablet by mouth 2 (two) times daily. One tab po bid x 10 days 08/07/17   Pisciotta, Mardella Layman  aspirin EC 81 MG tablet Take 81 mg by mouth daily.    [provider]  azithromycin (ZITHROMAX) 250 MG tablet Take 250 mg by mouth daily.    [provider]  benzonatate (TESSALON) 100 MG capsule Take 1 capsule (100 mg total) by mouth every 8 (eight) hours. 04/05/16   Melene Plan, DO    chlorpheniramine-HYDROcodone (TUSSIONEX) 10-8 MG/5ML LQCR Take 5 mLs by mouth.    [provider]  HYDROcodone-acetaminophen (NORCO/VICODIN) 5-325 MG tablet Take 1-2 tablets by mouth every 6 hours as needed for pain. 08/07/17   Pisciotta, Joni Reining, PA-C  omeprazole (PRILOSEC) 20 MG capsule Take 1 capsule (20 mg total) by mouth daily. 10/10/15   Palumbo, April, MD  ondansetron (ZOFRAN ODT) 8 MG disintegrating tablet Take 1 tablet (8 mg total) by mouth every 8 (eight) hours as needed for nausea or vomiting. 07/05/13   Margarita Grizzle, MD  sucralfate (CARAFATE) 1 GM/10ML suspension Take 10 mLs (1 g total) by mouth 4 (four) times daily -  with meals and at bedtime. 10/10/15   Palumbo, April, MD    Family History No family history on file.  Social History Social History   Tobacco Use  . Smoking status: Never Smoker  . Smokeless tobacco: Never Used  Substance Use Topics  . Alcohol use: Yes    Frequency: Never    Comment: occ  . Drug use: No     Allergies   Patient has no known allergies.   Review of Systems Review of Systems  All other systems reviewed and are negative.    Physical Exam Updated Vital Signs BP (!) 163/102 (BP Location: Right Arm)   Pulse 88   Temp 98.2 F (36.8 C) (Oral)  Resp 20   Ht 5\' 10"  (1.778 m)   Wt 99.8 kg   SpO2 100%   BMI 31.57 kg/m   Physical Exam  Nursing note and vitals reviewed.  47 year old male, resting comfortably and in no acute distress. Vital signs are significant for elevated blood pressure. Oxygen saturation is 100%, which is normal. Head is normocephalic and atraumatic. PERRLA, EOMI. Oropharynx is clear.  There is no sinus tenderness. Neck is nontender and supple without adenopathy or JVD. Back is nontender and there is no CVA tenderness. Lungs are clear without rales, wheezes, or rhonchi.  There is no prolongation of exhalation phase. Chest is nontender. Heart has regular rate and rhythm without murmur. Abdomen is soft, flat,  nontender without masses or hepatosplenomegaly and peristalsis is normoactive. Extremities have no cyanosis or edema, full range of motion is present. Skin is warm and dry without rash. Neurologic: Mental status is normal, cranial nerves are intact, there are no motor or sensory deficits.  ED Treatments / Results   Radiology Dg Chest 2 View  Result Date: 03/12/2018 CLINICAL DATA:  Cough and cold symptoms for 3 days. EXAM: CHEST - 2 VIEW COMPARISON:  04/05/2016 FINDINGS: The cardiomediastinal contours are normal. Pulmonary vasculature is normal. No consolidation, pleural effusion, or pneumothorax. No acute osseous abnormalities are seen. IMPRESSION: No acute pulmonary process. Electronically Signed   By: Narda Rutherford M.D.   On: 03/12/2018 03:15    Procedures Procedures  Medications Ordered in ED Medications  ipratropium-albuterol (DUONEB) 0.5-2.5 (3) MG/3ML nebulizer solution 3 mL (has no administration in time range)     Initial Impression / Assessment and Plan / ED Course  I have reviewed the triage vital signs and the nursing notes.  Pertinent imaging results that were available during my care of the patient were reviewed by me and considered in my medical decision making (see chart for details).  Cough and dyspnea.  Will check chest x-ray to rule out pneumonia.  Will try therapeutic trial of albuterol with ipratropium.  Old records are reviewed, and he does have a prior ED visit for viral URI with cough treated with benzonatate.  3:44 AM Chest x-ray shows no evidence of pneumonia.  He feels much better after above-noted treatment.  I suspect that he has allergic bronchospasm.  He is given a dose of prednisone and is discharged with prescriptions for prednisone and albuterol inhaler.    Final Clinical Impressions(s) / ED Diagnoses   Final diagnoses:  Cough    ED Discharge Orders         Ordered    albuterol (PROVENTIL HFA;VENTOLIN HFA) 108 (90 Base) MCG/ACT inhaler   Every 6 hours PRN     03/12/18 0341    predniSONE (DELTASONE) 20 MG tablet  Daily     03/12/18 0341           [name], MD 03/12/18 [phone]

## 2018-03-17 ENCOUNTER — Encounter (HOSPITAL_BASED_OUTPATIENT_CLINIC_OR_DEPARTMENT_OTHER): Payer: Self-pay | Admitting: *Deleted

## 2018-03-17 ENCOUNTER — Emergency Department (HOSPITAL_BASED_OUTPATIENT_CLINIC_OR_DEPARTMENT_OTHER)
Admission: EM | Admit: 2018-03-17 | Discharge: 2018-03-18 | Disposition: A | Discharge status: Home / Self Care | Payer: Self-pay | Attending: Emergency Medicine | Admitting: Emergency Medicine

## 2018-03-17 ENCOUNTER — Other Ambulatory Visit: Payer: Self-pay

## 2018-03-17 DIAGNOSIS — R0602 Shortness of breath: Secondary | ICD-10-CM | POA: Insufficient documentation

## 2018-03-17 DIAGNOSIS — Z79899 Other long term (current) drug therapy: Secondary | ICD-10-CM | POA: Insufficient documentation

## 2018-03-17 DIAGNOSIS — Z7982 Long term (current) use of aspirin: Secondary | ICD-10-CM | POA: Insufficient documentation

## 2018-03-17 DIAGNOSIS — R198 Other specified symptoms and signs involving the digestive system and abdomen: Secondary | ICD-10-CM

## 2018-03-17 DIAGNOSIS — R0989 Other specified symptoms and signs involving the circulatory and respiratory systems: Secondary | ICD-10-CM | POA: Insufficient documentation

## 2018-03-17 NOTE — ED Triage Notes (Signed)
Pt co cough x 1 week, seen 4 days ago for same

## 2018-03-18 ENCOUNTER — Emergency Department (HOSPITAL_BASED_OUTPATIENT_CLINIC_OR_DEPARTMENT_OTHER): Payer: Self-pay

## 2018-03-18 MED ORDER — OMEPRAZOLE 40 MG PO CPDR: 40.0000 mg | DELAYED_RELEASE_CAPSULE | Freq: Every day | ORAL | 0 refills | Status: DC

## 2018-03-18 MED ORDER — IPRATROPIUM-ALBUTEROL 0.5-2.5 (3) MG/3ML IN SOLN
3.0000 mL | RESPIRATORY_TRACT | Status: DC
  Administered 2018-03-18: 3 mL via RESPIRATORY_TRACT
  Filled 2018-03-18: qty 3

## 2018-03-18 MED ORDER — PANTOPRAZOLE SODIUM 40 MG PO TBEC
80.0000 mg | DELAYED_RELEASE_TABLET | Freq: Once | ORAL | Status: AC
  Administered 2018-03-18: 80 mg via ORAL
  Filled 2018-03-18: qty 2

## 2018-03-18 NOTE — ED Provider Notes (Signed)
[facility] Provider Note: Greg Dell, MD, FACEP  CSN: [id number] MRN: [id number] ARRIVAL: 03/17/18 at 2306 ROOM: MH09/MH09   CHIEF COMPLAINT  Cough   HISTORY OF PRESENT ILLNESS  03/18/18 12:26 AM [name] is a 47 y.o. male who complains of a one-week history of cough and shortness of breath.  He was seen on October 4 and treated with albuterol and prednisone.  He returns with persistent cough productive of clear sputum, shortness of breath and a sensation of difficulty pulling and deep breaths.  This occurs during the day and at night.  He has a sensation of something being in his throat.  He is not on any medications for acid reflux other than Tums or Mylanta as needed.  He denies nasal congestion.  He denies nausea, vomiting or diarrhea.  Past Medical History:  Diagnosis Date  . Blood clots in brain     Past Surgical History:  Procedure Laterality Date  . CARPAL TUNNEL RELEASE    . carpel tunnel      History reviewed. No pertinent family history.  Social History   Tobacco Use  . Smoking status: Never Smoker  . Smokeless tobacco: Never Used  Substance Use Topics  . Alcohol use: Yes    Frequency: Never    Comment: occ  . Drug use: No    Prior to Admission medications   Medication Sig Start Date End Date Taking? Authorizing Provider  albuterol (PROVENTIL HFA;VENTOLIN HFA) 108 (90 Base) MCG/ACT inhaler Inhale 2 puffs into the lungs every 6 (six) hours as needed for wheezing or shortness of breath (or coughing). 03/12/18   [name], MD  aspirin EC 81 MG tablet Take 81 mg by mouth daily.    [provider]  omeprazole (PRILOSEC) 40 MG capsule Take 1 capsule (40 mg total) by mouth daily. 03/18/18   [name], MD  predniSONE (DELTASONE) 20 MG tablet Take 3 tablets (60 mg total) by mouth daily. 03/12/18   [name], MD    Allergies Patient has no known allergies.   REVIEW OF SYSTEMS  Negative except as noted here or in the History  of Present Illness.   PHYSICAL EXAMINATION  Initial Vital Signs Blood pressure (!) 159/100, pulse 82, temperature 98 F (36.7 C), temperature source Oral, resp. rate 18, height 5\' 10"  (1.778 m), weight 99 kg, SpO2 98 %.  Examination General: Well-developed, well-nourished male in no acute distress; appearance consistent with age of record HENT: normocephalic; atraumatic; pharynx normal; no stridor Eyes: pupils equal, round and reactive to light; extraocular muscles intact Neck: supple Heart: regular rate and rhythm Lungs: clear to auscultation bilaterally Abdomen: soft; nondistended; nontender; bowel sounds present Extremities: No deformity; full range of motion; pulses normal Neurologic: Awake, alert and oriented; motor function intact in all extremities and symmetric; no facial droop Skin: Warm and dry Psychiatric: Normal mood and affect   RESULTS  Summary of this visit's results, reviewed by myself:   EKG Interpretation  Date/Time:    Ventricular Rate:    PR Interval:    QRS Duration:   QT Interval:    QTC Calculation:   R Axis:     Text Interpretation:        Laboratory Studies: No results found for this or any previous visit (from the past 24 hour(s)). Imaging Studies: Dg Neck Soft Tissue  Result Date: 03/18/2018 CLINICAL DATA:  Sore throat, cough, and shortness of breath for 1 week. EXAM: NECK SOFT TISSUES - 1+ VIEW  COMPARISON:  None. FINDINGS: There is no evidence of retropharyngeal soft tissue swelling or epiglottic enlargement. The cervical airway is unremarkable and no radio-opaque foreign body identified. Degenerative changes in the cervical spine. IMPRESSION: Negative. Electronically Signed   By: Burman Nieves M.D.   On: 03/18/2018 01:19   Dg Chest 2 View  Result Date: 03/18/2018 CLINICAL DATA:  Sore throat, cough, and shortness of breath for 1 week. Seen 4 days ago for same symptoms. Nonsmoker. EXAM: CHEST - 2 VIEW COMPARISON:  03/12/2018 FINDINGS: The  heart size and mediastinal contours are within normal limits. Both lungs are clear. The visualized skeletal structures are unremarkable. IMPRESSION: No active cardiopulmonary disease. Electronically Signed   By: Burman Nieves M.D.   On: 03/18/2018 01:18    ED COURSE and MDM  Nursing notes and initial vitals signs, including pulse oximetry, reviewed.  Vitals:   03/17/18 2319 03/17/18 2320 03/17/18 2321 03/18/18 0121  BP:   (!) 159/100   Pulse: 82     Resp: 18     Temp: 98 F (36.7 C)     TempSrc: Oral     SpO2: 98%   99%  Weight:  99 kg    Height:  5\' 10"  (1.778 m)     Suspect the patient's symptoms may be due to acid reflux.  We will start him on a PPI.  PROCEDURES    ED DIAGNOSES     ICD-10-CM   1. Shortness of breath R06.02   2. Globus sensation R09.89        Paula Libra, MD 03/18/18 [phone]

## 2018-11-28 ENCOUNTER — Other Ambulatory Visit: Payer: Self-pay | Admitting: *Deleted

## 2018-11-28 DIAGNOSIS — Z20822 Contact with and (suspected) exposure to covid-19: Secondary | ICD-10-CM

## 2018-12-01 NOTE — Addendum Note (Signed)
Addended by: Lissett Favorite M on: 12/01/2018 09:42 PM   Modules accepted: Orders  

## 2019-04-15 ENCOUNTER — Other Ambulatory Visit: Payer: Self-pay

## 2019-04-15 DIAGNOSIS — Z20822 Contact with and (suspected) exposure to covid-19: Secondary | ICD-10-CM

## 2019-04-17 LAB — NOVEL CORONAVIRUS, NAA: SARS-CoV-2, NAA: NOT DETECTED

## 2019-06-30 ENCOUNTER — Ambulatory Visit: Payer: Self-pay | Attending: Internal Medicine

## 2019-06-30 DIAGNOSIS — Z20822 Contact with and (suspected) exposure to covid-19: Secondary | ICD-10-CM | POA: Insufficient documentation

## 2019-07-01 LAB — NOVEL CORONAVIRUS, NAA: SARS-CoV-2, NAA: NOT DETECTED

## 2019-07-23 ENCOUNTER — Other Ambulatory Visit: Payer: Self-pay

## 2019-07-23 ENCOUNTER — Encounter (HOSPITAL_BASED_OUTPATIENT_CLINIC_OR_DEPARTMENT_OTHER): Payer: Self-pay | Admitting: Emergency Medicine

## 2019-07-23 ENCOUNTER — Emergency Department (HOSPITAL_BASED_OUTPATIENT_CLINIC_OR_DEPARTMENT_OTHER)
Admission: EM | Admit: 2019-07-23 | Discharge: 2019-07-24 | Disposition: A | Discharge status: Home / Self Care | Payer: Self-pay | Attending: Emergency Medicine | Admitting: Emergency Medicine

## 2019-07-23 DIAGNOSIS — Z79899 Other long term (current) drug therapy: Secondary | ICD-10-CM | POA: Insufficient documentation

## 2019-07-23 DIAGNOSIS — Z7982 Long term (current) use of aspirin: Secondary | ICD-10-CM | POA: Insufficient documentation

## 2019-07-23 DIAGNOSIS — I10 Essential (primary) hypertension: Secondary | ICD-10-CM | POA: Insufficient documentation

## 2019-07-23 MED ORDER — AMLODIPINE BESYLATE 5 MG PO TABS: 5.0000 mg | ORAL_TABLET | Freq: Every day | ORAL | 0 refills | Status: DC

## 2019-07-23 NOTE — Discharge Instructions (Addendum)
As we discussed, your blood pressure was slightly elevated here to the emergency department today.  This needs to be evaluated by your primary care doctor.  You need to monitor your blood pressure closely and write down the results that you can take it into your primary care doctor when you see them.  As we discussed, I have prescribed you a very low dose of medication that can start if you cannot get in with your primary care doctor soon.  Closely monitor symptoms.  Return emergency department for any worsening chest pain, difficulty breathing, headache, vision changes, nausea/vomiting, numbness/weakness of your arms or legs.

## 2019-07-23 NOTE — ED Triage Notes (Signed)
Pt c/o hypertension and HA today. Pt denies dizziness 162/103 in triage.

## 2019-07-23 NOTE — ED Notes (Signed)
Patient verbalizes understanding of discharge instructions. Opportunity for questioning and answers were provided. pt discharged from ED ambulatory.   

## 2019-07-23 NOTE — ED Provider Notes (Signed)
[facility] Provider Note   CSN: [id number] Arrival date & time: 07/23/19  2026     History Chief Complaint  Patient presents with  . Headache  . Hypertension    [name] is a 49 y.o. male past medical history of blood clots in the brain who presents for evaluation of headache and hypertension.  Patient reports that today, he had a mild headache.  He states it started off mild and may be reached a 5/10 at maximum intensity.  No preceding trauma, injury, fall.  He did not take any medication.  He states that once he noticed a headache, he took his blood pressure and found it to be elevated with systolic blood pressures in the 160s.  He states he took some apple cider vinegar but felt like he needed to come in to get it evaluated.  Currently, he states his headache is completely resolved.  He states he did not have any dizziness, blurry vision, double vision, chest pain, difficulty breathing, numbness/weakness of his arms or legs, difficulty walking.  He states he does not have a history of high blood pressure and does not take any medications daily.  He does report he has a history of blood clots in his brain that occurred about 10 years ago.  He states that at that time, he had a severe headache and ringing in his ears and vision changes and nausea.  He states that this headache that he had today did not feel similar.  He does have a primary care doctor but has not seen them in a long time.  The history is provided by the patient.       Past Medical History:  Diagnosis Date  . Blood clots in brain     There are no problems to display for this patient.   Past Surgical History:  Procedure Laterality Date  . CARPAL TUNNEL RELEASE    . carpel tunnel         History reviewed. No pertinent family history.  Social History   Tobacco Use  . Smoking status: Never Smoker  . Smokeless tobacco: Never Used  Substance Use Topics  . Alcohol use: Yes      Comment: occ  . Drug use: No    Home Medications Prior to Admission medications   Medication Sig Start Date End Date Taking? Authorizing Provider  albuterol (PROVENTIL HFA;VENTOLIN HFA) 108 (90 Base) MCG/ACT inhaler Inhale 2 puffs into the lungs every 6 (six) hours as needed for wheezing or shortness of breath (or coughing). 69/6/78   Delora Fuel, MD  amLODipine (NORVASC) 5 MG tablet Take 1 tablet (5 mg total) by mouth daily. 07/23/19 08/22/19  Volanda Napoleon, PA-C  aspirin EC 81 MG tablet Take 81 mg by mouth daily.    [provider]  omeprazole (PRILOSEC) 40 MG capsule Take 1 capsule (40 mg total) by mouth daily. 03/18/18   Molpus, John, MD  predniSONE (DELTASONE) 20 MG tablet Take 3 tablets (60 mg total) by mouth daily. 93/8/10   Delora Fuel, MD    Allergies    Patient has no known allergies.  Review of Systems   Review of Systems  Eyes: Negative for visual disturbance.  Respiratory: Negative for cough and shortness of breath.   Cardiovascular: Negative for chest pain.  Gastrointestinal: Negative for abdominal pain, nausea and vomiting.  Genitourinary: Negative for dysuria and hematuria.  Neurological: Positive for numbness and headaches. Negative for weakness.  All other systems  reviewed and are negative.   Physical Exam Updated Vital Signs BP (!) 168/80   Pulse 61   Temp 98.8 F (37.1 C) (Oral)   Resp 14   Ht 5\' 10"  (1.778 m)   Wt 95.3 kg   SpO2 100%   BMI 30.13 kg/m   Physical Exam Vitals and nursing note reviewed.  Constitutional:      Appearance: Normal appearance. He is well-developed.  HENT:     Head: Normocephalic and atraumatic.  Eyes:     General: Lids are normal.     Conjunctiva/sclera: Conjunctivae normal.     Pupils: Pupils are equal, round, and reactive to light.     Comments: PERRL. EOMs intact. No nystagmus. No neglect.   Neck:     Comments: Neck is supple and without rigidity. Cardiovascular:     Rate and Rhythm: Normal rate  and regular rhythm.     Pulses: Normal pulses.     Heart sounds: Normal heart sounds. No murmur. No friction rub. No gallop.   Pulmonary:     Effort: Pulmonary effort is normal.     Breath sounds: Normal breath sounds.  Abdominal:     Palpations: Abdomen is soft. Abdomen is not rigid.     Tenderness: There is no abdominal tenderness. There is no guarding.  Musculoskeletal:        General: Normal range of motion.     Cervical back: Full passive range of motion without pain.  Skin:    General: Skin is warm and dry.     Capillary Refill: Capillary refill takes less than 2 seconds.  Neurological:     Mental Status: He is alert and oriented to person, place, and time.     Comments: Cranial nerves III-XII intact Follows commands, Moves all extremities  5/5 strength to BUE and BLE  Sensation intact throughout all major nerve distributions No pronator drift. No gait abnormalities  No slurred speech. No facial droop.   Psychiatric:        Speech: Speech normal.     ED Results / Procedures / Treatments   Labs (all labs ordered are listed, but only abnormal results are displayed) Labs Reviewed - No data to display  EKG None  Radiology No results found.  Procedures Procedures (including critical care time)  Medications Ordered in ED Medications - No data to display  ED Course  I have reviewed the triage vital signs and the nursing notes.  Pertinent labs & imaging results that were available during my care of the patient were reviewed by me and considered in my medical decision making (see chart for details).    MDM Rules/Calculators/A&P                      49 year old male who presents for evaluation of headache and high blood pressure.  Reports a mild headache that began today.  Currently states headache is completely resolved.  He checked his blood pressure and his blood pressure was elevated with 160s systolically and came to the emergency department.  No chest pain,  dizziness, vision changes, numbness/weakness.  On initial arrival, he is afebrile, nontoxic-appearing.  Vital signs are stable.  No neuro deficits.  Patient tells me his headache has completely resolved.  His blood pressure initially when he came in was 162/73.  Vitals otherwise stable.  He appears in no distress.  His repeat blood pressure was 158/92.  At this time, his exam is not consistent with hypertensive emergency.  Additionally, history/physical exam is not concerning for CVA, dural venous thrombosis, intracranial hemorrhage.  Patient tells me that this headache feels nothing like when he previously had experience.  Additionally, history/physical exam is not concerning for meningitis.  I discussed with patient that at this time, his exam is not concerning for hypertensive emergency.  Additionally, no negation for imaging.  He has no complaints right now and has a completely normal neurological exam.  I discussed at length with him and his girlfriend.  I discussed my concerns about starting him on medication without having him follow-up with his primary care doctor.  I did review his records and every time he has been in the emergency department, his blood pressures been slightly elevated.  I discussed with him regarding starting him on a very low dose of blood pressure medication until he is able to be seen by his primary care doctor. Discussed patient with Dr. Donnald Garre who is agreeable to plan. At this time, patient exhibits no emergent life-threatening condition that require further evaluation in ED or admission. Patient had ample opportunity for questions and discussion. All patient's questions were answered with full understanding.  Portions of this note were generated with Scientist, clinical (histocompatibility and immunogenetics). Dictation errors may occur despite best attempts at proofreading.   Final Clinical Impression(s) / ED Diagnoses Final diagnoses:  Hypertension, unspecified type    Rx / DC Orders ED Discharge  Orders         Ordered    amLODipine (NORVASC) 5 MG tablet  Daily     07/23/19 2310           Rosana Hoes 07/24/19 0002    Arby Barrette, MD 07/24/19 1557

## 2019-08-29 ENCOUNTER — Encounter: Payer: Self-pay | Admitting: Family Medicine

## 2019-08-29 ENCOUNTER — Other Ambulatory Visit: Payer: Self-pay

## 2019-08-29 ENCOUNTER — Ambulatory Visit: Payer: Self-pay | Attending: Family Medicine | Admitting: Family Medicine

## 2019-08-29 VITALS — BP 126/75 | HR 68 | Temp 97.9°F | Resp 16 | Ht 70.0 in | Wt 192.8 lb

## 2019-08-29 DIAGNOSIS — H409 Unspecified glaucoma: Secondary | ICD-10-CM | POA: Insufficient documentation

## 2019-08-29 DIAGNOSIS — I10 Essential (primary) hypertension: Secondary | ICD-10-CM

## 2019-08-29 DIAGNOSIS — H4020X0 Unspecified primary angle-closure glaucoma, stage unspecified: Secondary | ICD-10-CM

## 2019-08-29 MED ORDER — AMLODIPINE BESYLATE 5 MG PO TABS: 5.0000 mg | ORAL_TABLET | Freq: Every day | ORAL | 6 refills | Status: DC

## 2019-08-29 NOTE — Progress Notes (Signed)
 Subjective:  Patient ID: [name], male    DOB: 07/09/1970  Age: 48 y.o. MRN: [id number]  CC: New Patient (Initial Visit) and Hypertension   HPI [name] is 48-year-old male with a previous history of left transverse and sigmoid sinus thrombosis in 09/2007 (completed anticoagulation with Coumadin), right eye glaucoma, hypertension who presents today to establish care. He is currently on amlodipine and his blood pressure is controlled.  He is tolerating his medications with no complaints of adverse effects. His glaucoma is followed by ophthalmology and he has an upcoming appointment in 2 days at which time he will undergo additional investigation and be commenced on ophthalmic eyedrops. He has no additional concerns today.  Past Medical History:  Diagnosis Date  . Blood clots in brain     Past Surgical History:  Procedure Laterality Date  . CARPAL TUNNEL RELEASE    . carpel tunnel      History reviewed. No pertinent family history.  No Known Allergies  Outpatient Medications Prior to Visit  Medication Sig Dispense Refill  . albuterol (PROVENTIL HFA;VENTOLIN HFA) 108 (90 Base) MCG/ACT inhaler Inhale 2 puffs into the lungs every 6 (six) hours as needed for wheezing or shortness of breath (or coughing). 1 Inhaler 0  . aspirin EC 81 MG tablet Take 81 mg by mouth daily.    . omeprazole (PRILOSEC) 40 MG capsule Take 1 capsule (40 mg total) by mouth daily. (Patient not taking: Reported on 08/29/2019) 30 capsule 0  . predniSONE (DELTASONE) 20 MG tablet Take 3 tablets (60 mg total) by mouth daily. (Patient not taking: Reported on 08/29/2019) 15 tablet 0  . amLODipine (NORVASC) 5 MG tablet Take 1 tablet (5 mg total) by mouth daily. 30 tablet 0   No facility-administered medications prior to visit.     ROS Review of Systems  Constitutional: Negative for activity change and appetite change.  HENT: Negative for sinus pressure and sore throat.   Eyes: Negative for visual  disturbance.  Respiratory: Negative for cough, chest tightness and shortness of breath.   Cardiovascular: Negative for chest pain and leg swelling.  Gastrointestinal: Negative for abdominal distention, abdominal pain, constipation and diarrhea.  Endocrine: Negative.   Genitourinary: Negative for dysuria.  Musculoskeletal: Negative for joint swelling and myalgias.  Skin: Negative for rash.  Allergic/Immunologic: Negative.   Neurological: Negative for weakness, light-headedness and numbness.  Psychiatric/Behavioral: Negative for dysphoric mood and suicidal ideas.    Objective:  BP 126/75   Pulse 68   Temp 97.9 F (36.6 C)   Resp 16   Ht 5' 10" (1.778 m)   Wt 192 lb 12.8 oz (87.5 kg)   SpO2 97%   BMI 27.66 kg/m   BP/Weight 08/29/2019 07/23/2019 03/17/2018  Systolic BP 126 168 159  Diastolic BP 75 80 100  Wt. (Lbs) 192.8 210 218.26  BMI 27.66 30.13 31.32      Physical Exam Constitutional:      Appearance: He is well-developed.  Neck:     Vascular: No JVD.  Cardiovascular:     Rate and Rhythm: Normal rate.     Heart sounds: Normal heart sounds. No murmur.  Pulmonary:     Effort: Pulmonary effort is normal.     Breath sounds: Normal breath sounds. No wheezing or rales.  Chest:     Chest wall: No tenderness.  Abdominal:     General: Bowel sounds are normal. There is no distension.     Palpations: Abdomen is soft. There is no   mass.     Tenderness: There is no abdominal tenderness.  Musculoskeletal:        General: Normal range of motion.     Right lower leg: No edema.     Left lower leg: No edema.  Neurological:     Mental Status: He is alert and oriented to person, place, and time.  Psychiatric:        Mood and Affect: Mood normal.     CMP Latest Ref Rng & Units 03/04/2012 09/08/2007 09/08/2007  Glucose 70 - 99 mg/dL 94 147(H) 154(H)  BUN 6 - 23 mg/dL 13 7 10  Creatinine 0.50 - 1.35 mg/dL 1.20 1.25 1.36  Sodium 135 - 145 mEq/L 139 137 138  Potassium 3.5 - 5.1 mEq/L  4.0 4.4 3.1(L)  Chloride 96 - 112 mEq/L 101 104 102  CO2 19 - 32 mEq/L 28 26 22  Calcium 8.4 - 10.5 mg/dL 9.5 8.7 9.4  Total Protein - - 7.0 -  Total Bilirubin - - 0.7 -  Alkaline Phos - - 52 -  AST - - 21 -  ALT - - 10 -    Lipid Panel     Component Value Date/Time   CHOL  09/08/2007 0950    130        ATP III CLASSIFICATION:  <200     mg/dL   Desirable  200-239  mg/dL   Borderline High  >=240    mg/dL   High   TRIG 23 09/08/2007 0950   HDL 55 09/08/2007 0950   CHOLHDL 2.4 09/08/2007 0950   VLDL 5 09/08/2007 0950   LDLCALC  09/08/2007 0950    70        Total Cholesterol/HDL:CHD Risk Coronary Heart Disease Risk Table                     Men   Women  1/2 Average Risk   3.4   3.3    CBC    Component Value Date/Time   WBC 4.5 03/04/2012 1145   RBC 4.44 03/04/2012 1145   HGB 13.7 03/04/2012 1145   HCT 38.7 (L) 03/04/2012 1145   PLT 254 03/04/2012 1145   MCV 87.2 03/04/2012 1145   MCH 30.9 03/04/2012 1145   MCHC 35.4 03/04/2012 1145   RDW 11.7 03/04/2012 1145   LYMPHSABS 4.4 (H) 09/08/2007 0200   MONOABS 0.6 09/08/2007 0200   EOSABS 0.1 09/08/2007 0200   BASOSABS 0.1 09/08/2007 0200    Lab Results  Component Value Date   HGBA1C  09/08/2007    5.6 (NOTE)   The ADA recommends the following therapeutic goals for glycemic   control related to Hgb A1C measurement:   Goal of Therapy:   < 7.0% Hgb A1C   Action Suggested:  > 8.0% Hgb A1C   Ref:  Diabetes Care, 22, Suppl. 1, 1999    Assessment & Plan:  1. Essential hypertension Controlled Counseled on blood pressure goal of less than 130/80, low-sodium, DASH diet, medication compliance, 150 minutes of moderate intensity exercise per week. Discussed medication compliance, adverse effects. - amLODipine (NORVASC) 5 MG tablet; Take 1 tablet (5 mg total) by mouth daily.  Dispense: 30 tablet; Refill: 6 - CMP14+EGFR; Future - Lipid panel; Future  2. Primary angle closure glaucoma of right eye, unspecified glaucoma stage,  unspecified primary angle-closure glaucoma type New diagnosis Follow-up with ophthalmology   Health Care Maintenance: He is due for colonoscopy but has no medical coverage at this   time.  Advised to apply for the [place] discount to facilitate referral.  He declines flu shot.  Meds ordered this encounter  Medications  . amLODipine (NORVASC) 5 MG tablet    Sig: Take 1 tablet (5 mg total) by mouth daily.    Dispense:  30 tablet    Refill:  6    Follow-up: Return in about 3 months (around 11/29/2019) for Hypertension.       Enobong Newlin, MD, FAAFP. [facility] , [place] [phone]   08/29/2019, 5:23 PM 

## 2019-08-29 NOTE — Patient Instructions (Addendum)
DASH Eating Plan DASH stands for "Dietary Approaches to Stop Hypertension." The DASH eating plan is a healthy eating plan that has been shown to reduce high blood pressure (hypertension). It may also reduce your risk for type 2 diabetes, heart disease, and stroke. The DASH eating plan may also help with weight loss. What are tips for following this plan?  General guidelines  Avoid eating more than 2,300 mg (milligrams) of salt (sodium) a day. If you have hypertension, you may need to reduce your sodium intake to 1,500 mg a day.  Limit alcohol intake to no more than 1 drink a day for nonpregnant women and 2 drinks a day for men. One drink equals 12 oz of beer, 5 oz of wine, or 1 oz of hard liquor.  Work with your health care provider to maintain a healthy body weight or to lose weight. Ask what an ideal weight is for you.  Get at least 30 minutes of exercise that causes your heart to beat faster (aerobic exercise) most days of the week. Activities may include walking, swimming, or biking.  Work with your health care provider or diet and nutrition specialist (dietitian) to adjust your eating plan to your individual calorie needs. Reading food labels   Check food labels for the amount of sodium per serving. Choose foods with less than 5 percent of the Daily Value of sodium. Generally, foods with less than 300 mg of sodium per serving fit into this eating plan.  To find whole grains, look for the word "whole" as the first word in the ingredient list. Shopping  Buy products labeled as "low-sodium" or "no salt added."  Buy fresh foods. Avoid canned foods and premade or frozen meals. Cooking  Avoid adding salt when cooking. Use salt-free seasonings or herbs instead of table salt or sea salt. Check with your health care provider or pharmacist before using salt substitutes.  Do not fry foods. Cook foods using healthy methods such as baking, boiling, grilling, and broiling instead.  Cook with  heart-healthy oils, such as olive, canola, soybean, or sunflower oil. Meal planning  Eat a balanced diet that includes: ? 5 or more servings of fruits and vegetables each day. At each meal, try to fill half of your plate with fruits and vegetables. ? Up to 6-8 servings of whole grains each day. ? Less than 6 oz of lean meat, poultry, or fish each day. A 3-oz serving of meat is about the same size as a deck of cards. One egg equals 1 oz. ? 2 servings of low-fat dairy each day. ? A serving of nuts, seeds, or beans 5 times each week. ? Heart-healthy fats. Healthy fats called Omega-3 fatty acids are found in foods such as flaxseeds and coldwater fish, like sardines, salmon, and mackerel.  Limit how much you eat of the following: ? Canned or prepackaged foods. ? Food that is high in trans fat, such as fried foods. ? Food that is high in saturated fat, such as fatty meat. ? Sweets, desserts, sugary drinks, and other foods with added sugar. ? Full-fat dairy products.  Do not salt foods before eating.  Try to eat at least 2 vegetarian meals each week.  Eat more home-cooked food and less restaurant, buffet, and fast food.  When eating at a restaurant, ask that your food be prepared with less salt or no salt, if possible. What foods are recommended? The items listed may not be a complete list. Talk with your dietitian about   what dietary choices are best for you. Grains Whole-grain or whole-wheat bread. Whole-grain or whole-wheat pasta. [place] rice. Oatmeal. Quinoa. Bulgur. Whole-grain and low-sodium cereals. Pita bread. Low-fat, low-sodium crackers. Whole-wheat flour tortillas. Vegetables Fresh or frozen vegetables (raw, steamed, roasted, or grilled). Low-sodium or reduced-sodium tomato and vegetable juice. Low-sodium or reduced-sodium tomato sauce and tomato paste. Low-sodium or reduced-sodium canned vegetables. Fruits All fresh, dried, or frozen fruit. Canned fruit in natural juice (without  added sugar). Meat and other protein foods Skinless chicken or turkey. Ground chicken or turkey. Pork with fat trimmed off. Fish and seafood. Egg whites. Dried beans, peas, or lentils. Unsalted nuts, nut butters, and seeds. Unsalted canned beans. Lean cuts of beef with fat trimmed off. Low-sodium, lean deli meat. Dairy Low-fat (1%) or fat-free (skim) milk. Fat-free, low-fat, or reduced-fat cheeses. Nonfat, low-sodium ricotta or cottage cheese. Low-fat or nonfat yogurt. Low-fat, low-sodium cheese. Fats and oils Soft margarine without trans fats. Vegetable oil. Low-fat, reduced-fat, or light mayonnaise and salad dressings (reduced-sodium). Canola, safflower, olive, soybean, and sunflower oils. Avocado. Seasoning and other foods Herbs. Spices. Seasoning mixes without salt. Unsalted popcorn and pretzels. Fat-free sweets. What foods are not recommended? The items listed may not be a complete list. Talk with your dietitian about what dietary choices are best for you. Grains Baked goods made with fat, such as croissants, muffins, or some breads. Dry pasta or rice meal packs. Vegetables Creamed or fried vegetables. Vegetables in a cheese sauce. Regular canned vegetables (not low-sodium or reduced-sodium). Regular canned tomato sauce and paste (not low-sodium or reduced-sodium). Regular tomato and vegetable juice (not low-sodium or reduced-sodium). Pickles. Olives. Fruits Canned fruit in a light or heavy syrup. Fried fruit. Fruit in cream or butter sauce. Meat and other protein foods Fatty cuts of meat. Ribs. Fried meat. Bacon. Sausage. Bologna and other processed lunch meats. Salami. Fatback. Hotdogs. Bratwurst. Salted nuts and seeds. Canned beans with added salt. Canned or smoked fish. Whole eggs or egg yolks. Chicken or turkey with skin. Dairy Whole or 2% milk, cream, and half-and-half. Whole or full-fat cream cheese. Whole-fat or sweetened yogurt. Full-fat cheese. Nondairy creamers. Whipped toppings.  Processed cheese and cheese spreads. Fats and oils Butter. Stick margarine. Lard. Shortening. Ghee. Bacon fat. Tropical oils, such as coconut, palm kernel, or palm oil. Seasoning and other foods Salted popcorn and pretzels. Onion salt, garlic salt, seasoned salt, table salt, and sea salt. Worcestershire sauce. Tartar sauce. Barbecue sauce. Teriyaki sauce. Soy sauce, including reduced-sodium. Steak sauce. Canned and packaged gravies. Fish sauce. Oyster sauce. Cocktail sauce. Horseradish that you find on the shelf. Ketchup. Mustard. Meat flavorings and tenderizers. Bouillon cubes. Hot sauce and Tabasco sauce. Premade or packaged marinades. Premade or packaged taco seasonings. Relishes. Regular salad dressings. Where to find more information:  National Heart, Lung, and Blood Institute: www.nhlbi.nih.gov  American Heart Association: www.heart.org Summary  The DASH eating plan is a healthy eating plan that has been shown to reduce high blood pressure (hypertension). It may also reduce your risk for type 2 diabetes, heart disease, and stroke.  With the DASH eating plan, you should limit salt (sodium) intake to 2,300 mg a day. If you have hypertension, you may need to reduce your sodium intake to 1,500 mg a day.  When on the DASH eating plan, aim to eat more fresh fruits and vegetables, whole grains, lean proteins, low-fat dairy, and heart-healthy fats.  Work with your health care provider or diet and nutrition specialist (dietitian) to adjust your eating plan to your   individual calorie needs. This information is not intended to replace advice given to you by your health care provider. Make sure you discuss any questions you have with your health care provider. Document Revised: 05/08/2017 Document Reviewed: 05/19/2016 Elsevier Patient Education  2020 Elsevier Inc.  

## 2019-09-01 ENCOUNTER — Other Ambulatory Visit: Payer: Self-pay

## 2019-09-01 ENCOUNTER — Ambulatory Visit: Payer: Self-pay | Attending: Family Medicine

## 2019-09-01 DIAGNOSIS — I10 Essential (primary) hypertension: Secondary | ICD-10-CM

## 2019-09-02 LAB — CMP14+EGFR
ALT: 11 IU/L (ref 0–44)
AST: 23 IU/L (ref 0–40)
Albumin/Globulin Ratio: 1.8 (ref 1.2–2.2)
Albumin: 4.6 g/dL (ref 4.0–5.0)
Alkaline Phosphatase: 82 IU/L (ref 39–117)
BUN/Creatinine Ratio: 10 (ref 9–20)
BUN: 13 mg/dL (ref 6–24)
Bilirubin Total: 0.3 mg/dL (ref 0.0–1.2)
CO2: 23 mmol/L (ref 20–29)
Calcium: 9.4 mg/dL (ref 8.7–10.2)
Chloride: 103 mmol/L (ref 96–106)
Creatinine, Ser: 1.3 mg/dL — ABNORMAL HIGH (ref 0.76–1.27)
GFR calc Af Amer: 74 mL/min/{1.73_m2} (ref >59)
GFR calc non Af Amer: 64 mL/min/{1.73_m2} (ref >59)
Globulin, Total: 2.5 g/dL (ref 1.5–4.5)
Glucose: 76 mg/dL (ref 65–99)
Potassium: 3.7 mmol/L (ref 3.5–5.2)
Sodium: 138 mmol/L (ref 134–144)
Total Protein: 7.1 g/dL (ref 6.0–8.5)

## 2019-09-02 LAB — LIPID PANEL
Chol/HDL Ratio: 2.4 ratio (ref 0.0–5.0)
Cholesterol, Total: 163 mg/dL (ref 100–199)
HDL: 68 mg/dL (ref >39)
LDL Chol Calc (NIH): 88 mg/dL (ref 0–99)
Triglycerides: 31 mg/dL (ref 0–149)
VLDL Cholesterol Cal: 7 mg/dL (ref 5–40)

## 2019-09-05 ENCOUNTER — Ambulatory Visit: Payer: Self-pay | Attending: Internal Medicine

## 2019-09-05 DIAGNOSIS — Z20822 Contact with and (suspected) exposure to covid-19: Secondary | ICD-10-CM

## 2019-09-06 LAB — NOVEL CORONAVIRUS, NAA: SARS-CoV-2, NAA: DETECTED — AB

## 2019-09-06 LAB — SARS-COV-2, NAA 2 DAY TAT

## 2019-09-07 ENCOUNTER — Emergency Department (HOSPITAL_BASED_OUTPATIENT_CLINIC_OR_DEPARTMENT_OTHER)
Admission: EM | Admit: 2019-09-07 | Discharge: 2019-09-08 | Disposition: A | Discharge status: Home / Self Care | Payer: Self-pay | Attending: Emergency Medicine | Admitting: Emergency Medicine

## 2019-09-07 ENCOUNTER — Other Ambulatory Visit: Payer: Self-pay

## 2019-09-07 ENCOUNTER — Encounter (HOSPITAL_BASED_OUTPATIENT_CLINIC_OR_DEPARTMENT_OTHER): Payer: Self-pay | Admitting: Emergency Medicine

## 2019-09-07 DIAGNOSIS — Z79899 Other long term (current) drug therapy: Secondary | ICD-10-CM | POA: Insufficient documentation

## 2019-09-07 DIAGNOSIS — I10 Essential (primary) hypertension: Secondary | ICD-10-CM | POA: Insufficient documentation

## 2019-09-07 DIAGNOSIS — N4833 Priapism, drug-induced: Secondary | ICD-10-CM | POA: Insufficient documentation

## 2019-09-07 DIAGNOSIS — Z7982 Long term (current) use of aspirin: Secondary | ICD-10-CM | POA: Insufficient documentation

## 2019-09-07 HISTORY — DX: Essential (primary) hypertension: I10

## 2019-09-07 HISTORY — DX: Hyperlipidemia, unspecified: E78.5

## 2019-09-07 HISTORY — DX: Unspecified glaucoma: H40.9

## 2019-09-07 MED ORDER — PHENYLEPHRINE 200 MCG/ML FOR PRIAPISM / HYPOTENSION
200.0000 ug | INTRAMUSCULAR | Status: DC | PRN
  Administered 2019-09-07: 200 ug via INTRACAVERNOUS
  Filled 2019-09-07: qty 50

## 2019-09-07 NOTE — ED Triage Notes (Signed)
Pt reports having erection since this afternoon after receiving injection therapy for ED.

## 2019-09-07 NOTE — ED Provider Notes (Signed)
[facility] EMERGENCY DEPARTMENT Provider Note   CSN: [id number] Arrival date & time: 09/07/19  2259   History Chief Complaint  Patient presents with  . Penis Pain    [name] is a 49 y.o. male.  The history is provided by the patient.  Penis Pain  He has history of hypertension, hyperlipidemia, erectile dysfunction and injected Trimix at about 1 PM.  He has had an erection since then.  He has used Trimix before, and the erection had not lasted this long.  Past Medical History:  Diagnosis Date  . Blood clots in brain   . Glaucoma   . Hyperlipidemia   . Hypertension     Patient Active Problem List   Diagnosis Date Noted  . Essential hypertension 08/29/2019  . Glaucoma 08/29/2019    Past Surgical History:  Procedure Laterality Date  . CARPAL TUNNEL RELEASE    . carpel tunnel         No family history on file.  Social History   Tobacco Use  . Smoking status: Never Smoker  . Smokeless tobacco: Never Used  Substance Use Topics  . Alcohol use: Yes    Comment: occ  . Drug use: No    Home Medications Prior to Admission medications   Medication Sig Start Date End Date Taking? Authorizing Provider  amLODipine (NORVASC) 5 MG tablet Take 1 tablet (5 mg total) by mouth daily. 08/29/19 09/28/19  Hoy Register, MD  aspirin EC 81 MG tablet Take 81 mg by mouth daily.    [provider]  Cholecalciferol (VITAMIN D-3) 5000 UNIT/ML LIQD Vitamin D-3 5000  Vitamin D-3 5000 units  Take 1 capsule daily    [provider]  tadalafil (CIALIS) 20 MG tablet tadalafil 20 mg tablet  Take 1 tablet by oral route as directed.    [provider]    Allergies    Patient has no known allergies.  Review of Systems   Review of Systems  Genitourinary: Positive for penile pain.  All other systems reviewed and are negative.   Physical Exam Updated Vital Signs BP (!) 148/94   Pulse (!) 58   Temp 98.1 F (36.7 C) (Oral)   Resp 18   Ht  5\' 9"  (1.753 m)   Wt 88.5 kg   SpO2 98%   BMI 28.80 kg/m   Physical Exam Vitals and nursing note reviewed.   49 year old male, resting comfortably and in no acute distress. Vital signs are significant for elevated blood pressure. Oxygen saturation is 98%, which is normal. Head is normocephalic and atraumatic. PERRLA, EOMI. Oropharynx is clear. Neck is nontender and supple without adenopathy or JVD. Back is nontender and there is no CVA tenderness. Lungs are clear without rales, wheezes, or rhonchi. Chest is nontender. Heart has regular rate and rhythm without murmur. Abdomen is soft, flat, nontender without masses or hepatosplenomegaly and peristalsis is normoactive. Genitalia: Circumcised penis, erection present.  Testes descended without masses.  No inguinal adenopathy. Extremities have no cyanosis or edema, full range of motion is present. Skin is warm and dry without rash. Neurologic: Mental status is normal, cranial nerves are intact, there are no motor or sensory deficits.  ED Results / Procedures / Treatments    Procedures Procedures  Medications Ordered in ED Medications  phenylephrine 200 mcg / ml CONC. DILUTION INJ (ED / Urology USE ONLY) (200 mcg Intracavernosal Given by Other 09/07/19 2330)    ED Course  I have reviewed the triage  vital signs and the nursing notes.  Pertinent labs & imaging results that were available during my care of the patient were reviewed by me and considered in my medical decision making (see chart for details).  MDM Rules/Calculators/A&P Priapism secondary to Trimix injection.  He will be given an injection of phenylephrine.  11:34 PM Phenylephrine 200 mcg injected in left corpus calliosum.  12:14 AM There has been complete detumescence.  He is referred back to his erectile dysfunction clinic.  Final Clinical Impression(s) / ED Diagnoses Final diagnoses:  Priapism, drug-induced  Elevated blood pressure reading with diagnosis of  hypertension    Rx / DC Orders ED Discharge Orders    None       Delora Fuel, MD 92/44/62 [phone]

## 2019-09-07 NOTE — ED Notes (Signed)
ED Provider at bedside. 

## 2019-09-08 NOTE — Discharge Instructions (Addendum)
Please contact the physician who prescribed Trimix for you. There can be damage to the penis from the prolonged erection which could affect future doses.  If you ever have an erection that lasts four hours, seek medical care immediately. Waiting increases the possibility of damaging the penis.

## 2019-09-16 MED FILL — LATANOPROST 0.005% EYE DRP: 0.005 | 18 days supply | Qty: 3 | Fill #0

## 2019-09-16 MED FILL — DORZOLAMIDE-TIMOLOL EYE DRP: 22.3-6.8 | 75 days supply | Qty: 10 | Fill #0

## 2019-11-30 ENCOUNTER — Ambulatory Visit: Payer: Self-pay | Admitting: Family Medicine

## 2019-12-26 ENCOUNTER — Ambulatory Visit: Payer: Self-pay | Admitting: Family Medicine

## 2020-02-09 ENCOUNTER — Telehealth: Payer: Self-pay | Admitting: Family Medicine

## 2020-02-09 NOTE — Telephone Encounter (Signed)
Returned call to patient and LVM to return call and schedule an appt with a provider so he can be referred out to have a colonoscopy.

## 2020-02-15 ENCOUNTER — Other Ambulatory Visit: Payer: Self-pay

## 2020-02-15 DIAGNOSIS — Z20822 Contact with and (suspected) exposure to covid-19: Secondary | ICD-10-CM

## 2020-02-17 LAB — SARS-COV-2, NAA 2 DAY TAT

## 2020-02-17 LAB — NOVEL CORONAVIRUS, NAA: SARS-CoV-2, NAA: NOT DETECTED

## 2020-04-06 ENCOUNTER — Encounter (HOSPITAL_BASED_OUTPATIENT_CLINIC_OR_DEPARTMENT_OTHER): Payer: Self-pay | Admitting: Emergency Medicine

## 2020-04-06 ENCOUNTER — Other Ambulatory Visit: Payer: Self-pay

## 2020-04-06 ENCOUNTER — Emergency Department (HOSPITAL_BASED_OUTPATIENT_CLINIC_OR_DEPARTMENT_OTHER)
Admission: EM | Admit: 2020-04-06 | Discharge: 2020-04-06 | Disposition: A | Discharge status: Home / Self Care | Payer: Self-pay | Attending: Emergency Medicine | Admitting: Emergency Medicine

## 2020-04-06 DIAGNOSIS — Z7982 Long term (current) use of aspirin: Secondary | ICD-10-CM | POA: Insufficient documentation

## 2020-04-06 DIAGNOSIS — Z79899 Other long term (current) drug therapy: Secondary | ICD-10-CM | POA: Insufficient documentation

## 2020-04-06 DIAGNOSIS — T50905A Adverse effect of unspecified drugs, medicaments and biological substances, initial encounter: Secondary | ICD-10-CM

## 2020-04-06 DIAGNOSIS — F410 Panic disorder [episodic paroxysmal anxiety] without agoraphobia: Secondary | ICD-10-CM | POA: Insufficient documentation

## 2020-04-06 DIAGNOSIS — I10 Essential (primary) hypertension: Secondary | ICD-10-CM | POA: Insufficient documentation

## 2020-04-06 MED ORDER — HYDROXYZINE HCL 10 MG PO TABS: 10.0000 mg | ORAL_TABLET | Freq: Three times a day (TID) | ORAL | 0 refills | Status: DC | PRN

## 2020-04-06 NOTE — ED Triage Notes (Signed)
Patient presents with complaints of anxiety attack onset approx 0000 this am; Denies any pain; ambulatory with steady gait.
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
## 2020-11-26 ENCOUNTER — Emergency Department (HOSPITAL_BASED_OUTPATIENT_CLINIC_OR_DEPARTMENT_OTHER): Payer: Self-pay

## 2020-11-26 ENCOUNTER — Other Ambulatory Visit: Payer: Self-pay

## 2020-11-26 ENCOUNTER — Emergency Department (HOSPITAL_BASED_OUTPATIENT_CLINIC_OR_DEPARTMENT_OTHER)
Admission: EM | Admit: 2020-11-26 | Discharge: 2020-11-26 | Disposition: A | Discharge status: Home / Self Care | Payer: Self-pay | Attending: Emergency Medicine | Admitting: Emergency Medicine

## 2020-11-26 ENCOUNTER — Encounter (HOSPITAL_BASED_OUTPATIENT_CLINIC_OR_DEPARTMENT_OTHER): Payer: Self-pay

## 2020-11-26 DIAGNOSIS — Z7982 Long term (current) use of aspirin: Secondary | ICD-10-CM | POA: Insufficient documentation

## 2020-11-26 DIAGNOSIS — I10 Essential (primary) hypertension: Secondary | ICD-10-CM | POA: Insufficient documentation

## 2020-11-26 DIAGNOSIS — M25561 Pain in right knee: Secondary | ICD-10-CM | POA: Insufficient documentation

## 2020-11-26 MED ORDER — MELOXICAM 15 MG PO TABS: 15.0000 mg | ORAL_TABLET | Freq: Every day | ORAL | 2 refills | Status: AC
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
## 2020-11-26 NOTE — ED Triage Notes (Signed)
Pt c/o right knee pain x 2 weeks-denies injury-NAD-steady gait

## 2020-11-26 NOTE — Discharge Instructions (Addendum)
Return if any problems.

## 2020-12-11 ENCOUNTER — Encounter (HOSPITAL_BASED_OUTPATIENT_CLINIC_OR_DEPARTMENT_OTHER): Payer: Self-pay | Admitting: *Deleted

## 2020-12-11 ENCOUNTER — Emergency Department (HOSPITAL_BASED_OUTPATIENT_CLINIC_OR_DEPARTMENT_OTHER)
Admission: EM | Admit: 2020-12-11 | Discharge: 2020-12-11 | Disposition: A | Discharge status: Home / Self Care | Payer: Self-pay | Attending: Emergency Medicine | Admitting: Emergency Medicine

## 2020-12-11 ENCOUNTER — Other Ambulatory Visit: Payer: Self-pay

## 2020-12-11 DIAGNOSIS — Z79899 Other long term (current) drug therapy: Secondary | ICD-10-CM | POA: Insufficient documentation

## 2020-12-11 DIAGNOSIS — I10 Essential (primary) hypertension: Secondary | ICD-10-CM | POA: Insufficient documentation

## 2020-12-11 DIAGNOSIS — N4889 Other specified disorders of penis: Secondary | ICD-10-CM | POA: Insufficient documentation

## 2020-12-11 DIAGNOSIS — Z7982 Long term (current) use of aspirin: Secondary | ICD-10-CM | POA: Insufficient documentation

## 2020-12-11 DIAGNOSIS — Z202 Contact with and (suspected) exposure to infections with a predominantly sexual mode of transmission: Secondary | ICD-10-CM | POA: Insufficient documentation

## 2020-12-11 LAB — URINALYSIS, ROUTINE W REFLEX MICROSCOPIC
Bilirubin Urine: NEGATIVE
Glucose, UA: NEGATIVE mg/dL
Ketones, ur: NEGATIVE mg/dL
Leukocytes,Ua: NEGATIVE
Nitrite: NEGATIVE
Protein, ur: NEGATIVE mg/dL
Specific Gravity, Urine: 1.015 (ref 1.005–1.030)
pH: 8 (ref 5.0–8.0)

## 2020-12-11 LAB — URINALYSIS, MICROSCOPIC (REFLEX)

## 2020-12-11 LAB — HIV ANTIBODY (ROUTINE TESTING W REFLEX): HIV Screen 4th Generation wRfx: NONREACTIVE

## 2020-12-11 MED ORDER — METRONIDAZOLE 500 MG PO TABS
2000.0000 mg | ORAL_TABLET | Freq: Once | ORAL | Status: AC
  Administered 2020-12-11: 2000 mg via ORAL
  Filled 2020-12-11: qty 4

## 2020-12-11 NOTE — ED Notes (Signed)
Patient has lesion to shaft of penis, denies pain, states he has had similar in same spot before and "it usually goes away on its own".

## 2020-12-11 NOTE — ED Provider Notes (Signed)
[facility] EMERGENCY DEPARTMENT Provider Note   CSN: [id number] Arrival date & time: 12/11/20  1346     History Chief Complaint  Patient presents with   penis lesion    [name] is a 50 y.o. male.  States that he was possibly exposed to trichomonas.  Has a lesion on his penis that he wanted evaluated.  Nonpainful.  Has had them before.  Did some recent grooming to his private areas.  The history is provided by the patient.  Exposure to STD This is a new problem. The problem has not changed since onset.Pertinent negatives include no chest pain, no abdominal pain, no headaches and no shortness of breath. Nothing aggravates the symptoms. Nothing relieves the symptoms. He has tried nothing for the symptoms. The treatment provided no relief.      Past Medical History:  Diagnosis Date   Blood clots in brain    Glaucoma    Hyperlipidemia    Hypertension     Patient Active Problem List   Diagnosis Date Noted   Essential hypertension 08/29/2019   Glaucoma 08/29/2019    Past Surgical History:  Procedure Laterality Date   CARPAL TUNNEL RELEASE     carpel tunnel         No family history on file.  Social History   Tobacco Use   Smoking status: Never   Smokeless tobacco: Never  Substance Use Topics   Alcohol use: Yes    Comment: occ   Drug use: No    Home Medications Prior to Admission medications   Medication Sig Start Date End Date Taking? Authorizing Provider  amLODipine (NORVASC) 5 MG tablet Take 1 tablet (5 mg total) by mouth daily. 08/29/19 09/28/19  Hoy Register, MD  aspirin EC 81 MG tablet Take 81 mg by mouth daily.    [provider]  Cholecalciferol (VITAMIN D-3) 5000 UNIT/ML LIQD Vitamin D-3 5000  Vitamin D-3 5000 units  Take 1 capsule daily    [provider]  hydrOXYzine (ATARAX/VISTARIL) 10 MG tablet Take 1 tablet (10 mg total) by mouth every 8 (eight) hours as needed for anxiety. 04/06/20   Palumbo, April, MD   meloxicam (MOBIC) 15 MG tablet Take 1 tablet (15 mg total) by mouth daily. 11/26/20 11/26/21  Elson Areas, PA-C  tadalafil (CIALIS) 20 MG tablet tadalafil 20 mg tablet  Take 1 tablet by oral route as directed.    [provider]    Allergies    Patient has no known allergies.  Review of Systems   Review of Systems  Respiratory:  Negative for shortness of breath.   Cardiovascular:  Negative for chest pain.  Gastrointestinal:  Negative for abdominal pain.  Genitourinary:  Positive for genital sores (painless lesion). Negative for difficulty urinating, penile discharge, penile pain, penile swelling and testicular pain.  Neurological:  Negative for headaches.   Physical Exam Updated Vital Signs BP (!) 157/96 (BP Location: Left Arm)   Pulse (!) 55   Temp 98.8 F (37.1 C) (Oral)   Resp 18   Ht 5\' 9"  (1.753 m)   Wt 97.5 kg   SpO2 98%   BMI 31.75 kg/m   Physical Exam Constitutional:      General: He is not in acute distress. Abdominal:     General: There is no distension.  Genitourinary:    Penis: Normal.      Testes: Normal.     Comments: 1 area of slightly raised lesion that is nonulcerated and  nonpainful Neurological:     Mental Status: He is alert.    ED Results / Procedures / Treatments   Labs (all labs ordered are listed, but only abnormal results are displayed) Labs Reviewed  URINALYSIS, ROUTINE W REFLEX MICROSCOPIC - Abnormal; Notable for the following components:      Result Value   Hgb urine dipstick TRACE (*)    All other components within normal limits  URINALYSIS, MICROSCOPIC (REFLEX) - Abnormal; Notable for the following components:   Bacteria, UA MANY (*)    All other components within normal limits  RPR  HIV ANTIBODY (ROUTINE TESTING W REFLEX)  GC/CHLAMYDIA PROBE AMP ([place]) NOT AT [facility]    EKG None  Radiology No results found.  Procedures Procedures   Medications Ordered in ED Medications  metroNIDAZOLE (FLAGYL) tablet  2,000 mg (has no administration in time range)    ED Course  I have reviewed the triage vital signs and the nursing notes.  Pertinent labs & imaging results that were available during my care of the patient were reviewed by me and considered in my medical decision making (see chart for details).    MDM Rules/Calculators/A&P                          Wadell Craddock is here for evaluation of penile lesion and treatment for trichomonas.  Normal vitals.  No fever.  States that he was exposed to trichomonas.  Will treat with Flagyl.  Overall is asymptomatic.  He has a lesion on his penis that appears to be an inflamed hair follicle.  Did some recent grooming.  Lesion is nonpainful, not ulcerated.  States he has had them before that come and go.  Does not appear to be consistent with herpes.  Will testing for syphilis, HIV, gonorrhea and chlamydia.  We will have him continue to do some watchful waiting to see if lesion gets worse or ulcerated.  Discharged in good condition.  Understands return precautions.  This chart was dictated using voice recognition software.  Despite best efforts to proofread,  errors can occur which can change the documentation meaning.   Final Clinical Impression(s) / ED Diagnoses Final diagnoses:  Possible exposure to STD    Rx / DC Orders ED Discharge Orders     None        Virgina Norfolk, DO 12/11/20 1601

## 2020-12-11 NOTE — ED Triage Notes (Signed)
C/o lesion to shaft of penis x 2 days, and requesting std check

## 2020-12-11 NOTE — Discharge Instructions (Addendum)
Your results online as discussed.

## 2020-12-12 LAB — GC/CHLAMYDIA PROBE AMP (~~LOC~~) NOT AT ARMC
Chlamydia: NEGATIVE
Comment: NEGATIVE
Comment: NORMAL
Neisseria Gonorrhea: NEGATIVE

## 2020-12-12 LAB — RPR: RPR Ser Ql: NONREACTIVE

## 2021-01-30 ENCOUNTER — Other Ambulatory Visit: Payer: Self-pay

## 2021-01-30 ENCOUNTER — Ambulatory Visit: Payer: Self-pay | Attending: Family Medicine | Admitting: Family Medicine

## 2021-01-30 VITALS — BP 131/84 | HR 51 | Ht 69.0 in | Wt 202.0 lb

## 2021-01-30 DIAGNOSIS — M25561 Pain in right knee: Secondary | ICD-10-CM

## 2021-01-30 DIAGNOSIS — Z125 Encounter for screening for malignant neoplasm of prostate: Secondary | ICD-10-CM

## 2021-01-30 DIAGNOSIS — Z0000 Encounter for general adult medical examination without abnormal findings: Secondary | ICD-10-CM

## 2021-01-30 DIAGNOSIS — Z1211 Encounter for screening for malignant neoplasm of colon: Secondary | ICD-10-CM

## 2021-01-30 DIAGNOSIS — Z1159 Encounter for screening for other viral diseases: Secondary | ICD-10-CM

## 2021-01-30 NOTE — Progress Notes (Signed)
Subjective:  Patient ID: [name], male    DOB: 02-May-1971  Age: 50 y.o. MRN: [id number]  CC: Annual Exam   HPI [name] is a 50 y.o. 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  Interval History: He has had R medial knee discomfort for the last couple weeks. He constantly has to pivot to turn his R knee at work when getting up from a sitting position and trying to get out through a door at work.  He had an ED visit where he received meloxicam which has been effective. Knee x-ray from 11/2020 revealed an equivocal small knee effusion.  Past Medical History:  Diagnosis Date   Blood clots in brain    Glaucoma    Hyperlipidemia    Hypertension     Past Surgical History:  Procedure Laterality Date   CARPAL TUNNEL RELEASE     carpel tunnel      No family history on file.  No Known Allergies  Outpatient Medications Prior to Visit  Medication Sig Dispense Refill   aspirin EC 81 MG tablet Take 81 mg by mouth daily.     Cholecalciferol (VITAMIN D-3) 5000 UNIT/ML LIQD Vitamin D-3 5000  Vitamin D-3 5000 units  Take 1 capsule daily     hydrOXYzine (ATARAX/VISTARIL) 10 MG tablet Take 1 tablet (10 mg total) by mouth every 8 (eight) hours as needed for anxiety. 3 tablet 0   meloxicam (MOBIC) 15 MG tablet Take 1 tablet (15 mg total) by mouth daily. 30 tablet 2   tadalafil (CIALIS) 20 MG tablet tadalafil 20 mg tablet  Take 1 tablet by oral route as directed.     amLODipine (NORVASC) 5 MG tablet Take 1 tablet (5 mg total) by mouth daily. 30 tablet 6   No facility-administered medications prior to visit.     ROS Review of Systems  Constitutional:  Negative for activity change and appetite change.  HENT:  Negative for sinus pressure and sore throat.   Eyes:  Negative for visual disturbance.  Respiratory:  Negative for cough, chest tightness and shortness  of breath.   Cardiovascular:  Negative for chest pain and leg swelling.  Gastrointestinal:  Negative for abdominal distention, abdominal pain, constipation and diarrhea.  Endocrine: Negative.   Genitourinary:  Negative for dysuria.  Musculoskeletal:        See HPI  Skin:  Negative for rash.  Allergic/Immunologic: Negative.   Neurological:  Negative for weakness, light-headedness and numbness.  Psychiatric/Behavioral:  Negative for dysphoric mood and suicidal ideas.    Objective:  BP 131/84   Pulse (!) 51   Ht '5\' 9"'  (1.753 m)   Wt 202 lb (91.6 kg)   SpO2 100%   BMI 29.83 kg/m   BP/Weight 01/30/2021 12/11/2020 2/35/3614  Systolic BP 431 540 086  Diastolic BP 84 85 79  Wt. (Lbs) 202 215 201.5  BMI 29.83 31.75 28.91      Physical Exam Exam conducted with a chaperone present.  Constitutional:      Appearance: He is well-developed.  HENT:     Head: Normocephalic and atraumatic.     Right Ear: External ear normal.     Left Ear: External ear normal.  Eyes:     Conjunctiva/sclera: Conjunctivae normal.     Pupils: Pupils are equal, round, and reactive to light.  Neck:     Trachea: No tracheal deviation.  Cardiovascular:  Rate and Rhythm: Regular rhythm. Bradycardia present.     Heart sounds: Normal heart sounds. No murmur heard. Pulmonary:     Effort: Pulmonary effort is normal. No respiratory distress.     Breath sounds: Normal breath sounds. No wheezing.  Chest:     Chest wall: No tenderness.  Abdominal:     General: Bowel sounds are normal.     Palpations: Abdomen is soft. There is no mass.     Tenderness: There is no abdominal tenderness.     Hernia: There is no hernia in the left inguinal area or right inguinal area.  Genitourinary:    Pubic Area: No rash.      Penis: Normal.      Testes: Normal.        Right: Mass not present.        Left: Mass not present.  Musculoskeletal:        General: No tenderness. Normal range of motion.     Cervical back: Normal  range of motion and neck supple.     Comments: Slight tenderness to palpation of medial aspect of right knee.  Normal range of motion of right knee, no crepitus.  Skin:    General: Skin is warm and dry.  Neurological:     Mental Status: He is alert and oriented to person, place, and time.    CMP Latest Ref Rng & Units 09/01/2019 03/04/2012 09/08/2007  Glucose 65 - 99 mg/dL 76 94 147(H)  BUN 6 - 24 mg/dL '13 13 7  ' Creatinine 0.76 - 1.27 mg/dL 1.30(H) 1.20 1.25  Sodium 134 - 144 mmol/L 138 139 137  Potassium 3.5 - 5.2 mmol/L 3.7 4.0 4.4  Chloride 96 - 106 mmol/L 103 101 104  CO2 20 - 29 mmol/L '23 28 26  ' Calcium 8.7 - 10.2 mg/dL 9.4 9.5 8.7  Total Protein 6.0 - 8.5 g/dL 7.1 - 7.0  Total Bilirubin 0.0 - 1.2 mg/dL 0.3 - 0.7  Alkaline Phos 39 - 117 IU/L 82 - 52  AST 0 - 40 IU/L 23 - 21  ALT 0 - 44 IU/L 11 - 10    Lipid Panel     Component Value Date/Time   CHOL 163 09/01/2019 0930   TRIG 31 09/01/2019 0930   HDL 68 09/01/2019 0930   CHOLHDL 2.4 09/01/2019 0930   CHOLHDL 2.4 09/08/2007 0950   VLDL 5 09/08/2007 0950   LDLCALC 88 09/01/2019 0930    CBC    Component Value Date/Time   WBC 4.5 03/04/2012 1145   RBC 4.44 03/04/2012 1145   HGB 13.7 03/04/2012 1145   HCT 38.7 (L) 03/04/2012 1145   PLT 254 03/04/2012 1145   MCV 87.2 03/04/2012 1145   MCH 30.9 03/04/2012 1145   MCHC 35.4 03/04/2012 1145   RDW 11.7 03/04/2012 1145   LYMPHSABS 4.4 (H) 09/08/2007 0200   MONOABS 0.6 09/08/2007 0200   EOSABS 0.1 09/08/2007 0200   BASOSABS 0.1 09/08/2007 0200    Lab Results  Component Value Date   HGBA1C  09/08/2007    5.6 (NOTE)   The ADA recommends the following therapeutic goals for glycemic   control related to Hgb A1C measurement:   Goal of Therapy:   < 7.0% Hgb A1C   Action Suggested:  > 8.0% Hgb A1C   Ref:  Diabetes Care, 22, Suppl. 1, 1999    Assessment & Plan:  1. Annual physical exam Counseled on 150 minutes of exercise per week, healthy eating (including decreased  daily  intake of saturated fats, cholesterol, added sugars, sodium), routine healthcare maintenance. - CMP14+EGFR  2. Screening for colon cancer - Ambulatory referral to Gastroenterology  3. Screening for prostate cancer - PSA, total and free  4. Need for hepatitis C screening test - HCV RNA quant rflx ultra or genotyp([facility])  5. Acute pain of right knee Currently on meloxicam with some improvement Advised to use ice, knee brace If symptoms persist consider referral to orthopedic    No orders of the defined types were placed in this encounter.   Return in about 6 months (around 08/02/2021) for Medical conditions.       Charlott Rakes, MD, FAAFP. [facility] and [place], [place]   01/30/2021, 3:41 PM

## 2021-01-30 NOTE — Progress Notes (Signed)
Physical.  Pain in right knee.

## 2021-01-30 NOTE — Patient Instructions (Signed)
Health Maintenance, Male Adopting a healthy lifestyle and getting preventive care are important in promoting health and wellness. Ask your health care provider about: The right schedule for you to have regular tests and exams. Things you can do on your own to prevent diseases and keep yourself healthy. What should I know about diet, weight, and exercise? Eat a healthy diet  Eat a diet that includes plenty of vegetables, fruits, low-fat dairy products, and lean protein. Do not eat a lot of foods that are high in solid fats, added sugars, or sodium.  Maintain a healthy weight Body mass index (BMI) is a measurement that can be used to identify possible weight problems. It estimates body fat based on height and weight. Your health care provider can help determine your BMI and help you achieve or maintain ahealthy weight. Get regular exercise Get regular exercise. This is one of the most important things you can do for your health. Most adults should: Exercise for at least 150 minutes each week. The exercise should increase your heart rate and make you sweat (moderate-intensity exercise). Do strengthening exercises at least twice a week. This is in addition to the moderate-intensity exercise. Spend less time sitting. Even light physical activity can be beneficial. Watch cholesterol and blood lipids Have your blood tested for lipids and cholesterol at 52 years of age, then havethis test every 5 years. You may need to have your cholesterol levels checked more often if: Your lipid or cholesterol levels are high. You are older than 52 years of age. You are at high risk for heart disease. What should I know about cancer screening? Many types of cancers can be detected early and may often be prevented. Depending on your health history and family history, you may need to have cancer screening at various ages. This may include screening for: Colorectal cancer. Prostate cancer. Skin cancer. Lung  cancer. What should I know about heart disease, diabetes, and high blood pressure? Blood pressure and heart disease High blood pressure causes heart disease and increases the risk of stroke. This is more likely to develop in people who have high blood pressure readings, are of African descent, or are overweight. Talk with your health care provider about your target blood pressure readings. Have your blood pressure checked: Every 3-5 years if you are 18-39 years of age. Every year if you are 40 years old or older. If you are between the ages of 65 and 75 and are a current or former smoker, ask your health care provider if you should have a one-time screening for abdominal aortic aneurysm (AAA). Diabetes Have regular diabetes screenings. This checks your fasting blood sugar level. Have the screening done: Once every three years after age 45 if you are at a normal weight and have a low risk for diabetes. More often and at a younger age if you are overweight or have a high risk for diabetes. What should I know about preventing infection? Hepatitis B If you have a higher risk for hepatitis B, you should be screened for this virus. Talk with your health care provider to find out if you are at risk forhepatitis B infection. Hepatitis C Blood testing is recommended for: Everyone born from 1945 through 1965. Anyone with known risk factors for hepatitis C. Sexually transmitted infections (STIs) You should be screened each year for STIs, including gonorrhea and chlamydia, if: You are sexually active and are younger than 52 years of age. You are older than 52 years of age   and your health care provider tells you that you are at risk for this type of infection. Your sexual activity has changed since you were last screened, and you are at increased risk for chlamydia or gonorrhea. Ask your health care provider if you are at risk. Ask your health care provider about whether you are at high risk for HIV.  Your health care provider may recommend a prescription medicine to help prevent HIV infection. If you choose to take medicine to prevent HIV, you should first get tested for HIV. You should then be tested every 3 months for as long as you are taking the medicine. Follow these instructions at home: Lifestyle Do not use any products that contain nicotine or tobacco, such as cigarettes, e-cigarettes, and chewing tobacco. If you need help quitting, ask your health care provider. Do not use street drugs. Do not share needles. Ask your health care provider for help if you need support or information about quitting drugs. Alcohol use Do not drink alcohol if your health care provider tells you not to drink. If you drink alcohol: Limit how much you have to 0-2 drinks a day. Be aware of how much alcohol is in your drink. In the [place] one drink equals one 12 oz bottle of beer (355 mL), one 5 oz glass of wine (148 mL), or one 1 oz glass of hard liquor (44 mL). General instructions Schedule regular health, dental, and eye exams. Stay current with your vaccines. Tell your health care provider if: You often feel depressed. You have ever been abused or do not feel safe at home. Summary Adopting a healthy lifestyle and getting preventive care are important in promoting health and wellness. Follow your health care provider's instructions about healthy diet, exercising, and getting tested or screened for diseases. Follow your health care provider's instructions on monitoring your cholesterol and blood pressure. This information is not intended to replace advice given to you by your health care provider. Make sure you discuss any questions you have with your healthcare provider. Document Revised: 05/19/2018 Document Reviewed: 05/19/2018 Elsevier Patient Education  2022 Elsevier Inc.  

## 2021-01-31 ENCOUNTER — Encounter: Payer: Self-pay | Admitting: Family Medicine

## 2021-02-01 ENCOUNTER — Telehealth: Payer: Self-pay

## 2021-02-01 LAB — HCV RNA QUANT RFLX ULTRA OR GENOTYP: HCV Quant Baseline: NOT DETECTED IU/mL

## 2021-02-01 LAB — CMP14+EGFR
ALT: 13 IU/L (ref 0–44)
AST: 22 IU/L (ref 0–40)
Albumin/Globulin Ratio: 2.1 (ref 1.2–2.2)
Albumin: 4.9 g/dL (ref 3.8–4.9)
Alkaline Phosphatase: 80 IU/L (ref 44–121)
BUN/Creatinine Ratio: 9 (ref 9–20)
BUN: 11 mg/dL (ref 6–24)
Bilirubin Total: 0.4 mg/dL (ref 0.0–1.2)
CO2: 27 mmol/L (ref 20–29)
Calcium: 9.7 mg/dL (ref 8.7–10.2)
Chloride: 101 mmol/L (ref 96–106)
Creatinine, Ser: 1.29 mg/dL — ABNORMAL HIGH (ref 0.76–1.27)
Globulin, Total: 2.3 g/dL (ref 1.5–4.5)
Glucose: 78 mg/dL (ref 65–99)
Potassium: 4.7 mmol/L (ref 3.5–5.2)
Sodium: 140 mmol/L (ref 134–144)
Total Protein: 7.2 g/dL (ref 6.0–8.5)
eGFR: 67 mL/min/{1.73_m2} (ref >59)

## 2021-02-01 LAB — PSA, TOTAL AND FREE
PSA, Free Pct: 34.3 %
PSA, Free: 0.24 ng/mL
Prostate Specific Ag, Serum: 0.7 ng/mL (ref 0.0–4.0)

## 2021-02-01 NOTE — Telephone Encounter (Signed)
Patient name and DOB has been verified Patient was informed of lab results. Patient had no questions.  

## 2021-02-01 NOTE — Telephone Encounter (Signed)
-----   Message from Hoy Register, MD sent at 02/01/2021 11:29 AM EDT ----- Please inform the patient that labs are stable.

## 2021-03-12 ENCOUNTER — Encounter: Payer: Self-pay | Admitting: Family Medicine

## 2021-03-13 ENCOUNTER — Telehealth: Payer: Self-pay | Admitting: Family Medicine

## 2021-03-13 ENCOUNTER — Other Ambulatory Visit: Payer: Self-pay | Admitting: Family Medicine

## 2021-03-13 MED ORDER — HYDROXYZINE HCL 10 MG PO TABS: 10.0000 mg | ORAL_TABLET | Freq: Three times a day (TID) | ORAL | 1 refills | Status: DC | PRN

## 2021-03-13 NOTE — Telephone Encounter (Signed)
Can you please assist him with grief counseling resources?  Thank you

## 2021-03-18 NOTE — Telephone Encounter (Signed)
First attempt to reach pt, no answer, left vm.

## 2021-04-10 ENCOUNTER — Encounter: Payer: Self-pay | Admitting: Family Medicine

## 2021-04-10 ENCOUNTER — Telehealth (HOSPITAL_BASED_OUTPATIENT_CLINIC_OR_DEPARTMENT_OTHER): Payer: Self-pay | Admitting: Family Medicine

## 2021-04-10 DIAGNOSIS — U071 COVID-19: Secondary | ICD-10-CM

## 2021-04-10 MED ORDER — NIRMATRELVIR/RITONAVIR (PAXLOVID)TABLET: 3.0000 | ORAL_TABLET | Freq: Two times a day (BID) | ORAL | 0 refills | Status: AC

## 2021-04-10 NOTE — Progress Notes (Signed)
Virtual Visit via Video Note  I connected with [name], on 04/10/2021 at 11:15 AM by video enabled telemedicine device due to the COVID-19 pandemic and verified that I am speaking with the correct person using two identifiers.   Consent: I discussed the limitations, risks, security and privacy concerns of performing an evaluation and management service by telemedicine and the availability of in person appointments. I also discussed with the patient that there may be a patient responsible charge related to this service. The patient expressed understanding and agreed to proceed.   Location of Patient: [place] [facility] Provider: Clinic   Persons participating in Telemedicine visit: [name] Dr. Alvis Lemmings     History of Present Illness: [name] is a 50 y.o. year old male  with a history of hypertension seen for an acute office visit.    Yesterday he had some cough and stuffy nose, chest congestion He was in an event 3 days ago and there was someone who tested positive. He has no fever, no dyspnea, no GI symptoms. He is not vaccinated and has had COVID infection in the past.  Past Medical History:  Diagnosis Date   Blood clots in brain    Glaucoma    Hyperlipidemia    Hypertension    No Known Allergies  Current Outpatient Medications on File Prior to Visit  Medication Sig Dispense Refill   amLODipine (NORVASC) 5 MG tablet Take 1 tablet (5 mg total) by mouth daily. 30 tablet 6   aspirin EC 81 MG tablet Take 81 mg by mouth daily.     Cholecalciferol (VITAMIN D-3) 5000 UNIT/ML LIQD Vitamin D-3 5000  Vitamin D-3 5000 units  Take 1 capsule daily     hydrOXYzine (ATARAX/VISTARIL) 10 MG tablet Take 1 tablet (10 mg total) by mouth every 8 (eight) hours as needed for anxiety. 60 tablet 1   meloxicam (MOBIC) 15 MG tablet Take 1 tablet (15 mg total) by mouth daily. 30 tablet 2   tadalafil (CIALIS) 20 MG tablet tadalafil 20 mg tablet  Take 1 tablet by oral route as  directed.     No current facility-administered medications on file prior to visit.    ROS: See HPI  Observations/Objective: Awake, Alert, oriented x3 Not in acute distress Normal respiration Normal mood  CMP Latest Ref Rng & Units 01/30/2021 09/01/2019 03/04/2012  Glucose 65 - 99 mg/dL 78 76 94  BUN 6 - 24 mg/dL 11 13 13   Creatinine 0.76 - 1.27 mg/dL ) 9.14(N) 8.29(F  Sodium 134 - 144 mmol/L 140 138 139  Potassium 3.5 - 5.2 mmol/L 4.7 3.7 4.0  Chloride 96 - 106 mmol/L 101 103 101  CO2 20 - 29 mmol/L 27 23 28   Calcium 8.7 - 10.2 mg/dL 9.7 9.4 9.5  Total Protein 6.0 - 8.5 g/dL 7.2 7.1 -  Total Bilirubin 0.0 - 1.2 mg/dL 0.4 0.3 -  Alkaline Phos 44 - 121 IU/L 80 82 -  AST 0 - 40 IU/L 22 23 -  ALT 0 - 44 IU/L 13 11 -    Lipid Panel     Component Value Date/Time   CHOL 163 09/01/2019 0930   TRIG 31 09/01/2019 0930   HDL 68 09/01/2019 0930   CHOLHDL 2.4 09/01/2019 0930   CHOLHDL 2.4 09/08/2007 0950   VLDL 5 09/08/2007 0950   LDLCALC 88 09/01/2019 0930   LABVLDL 7 09/01/2019 0930    Lab Results  Component Value Date   HGBA1C  09/08/2007  5.6 (NOTE)   The ADA recommends the following therapeutic goals for glycemic   control related to Hgb A1C measurement:   Goal of Therapy:   < 7.0% Hgb A1C   Action Suggested:  > 8.0% Hgb A1C   Ref:  Diabetes Care, 22, Suppl. 1, 1999     Assessment and Plan: 1. COVID-19 virus infection Stable Counseled on need for antiviral medication especially since he is not vaccinated and is at higher risk of complications Discussed adverse effects, precautions Labs are normal - nirmatrelvir/ritonavir EUA (PAXLOVID) 20 x 150 MG & 10 x 100MG  TABS; Take 3 tablets by mouth 2 (two) times daily for 5 days. (Take nirmatrelvir 150 mg two tablets twice daily for 5 days and ritonavir 100 mg one tablet twice daily for 5 days) Patient GFR is 67  Dispense: 30 tablet; Refill: 0   Follow Up Instructions: Keep previously scheduled appointment   I  discussed the assessment and treatment plan with the patient. The patient was provided an opportunity to ask questions and all were answered. The patient agreed with the plan and demonstrated an understanding of the instructions.   The patient was advised to call back or seek an in-person evaluation if the symptoms worsen or if the condition fails to improve as anticipated.     I provided 11 minutes total of Telehealth time during this encounter including median intraservice time, reviewing previous notes, investigations, ordering medications, medical decision making, coordinating care and patient verbalized understanding at the end of the visit.     , MD, FAAFP. [facility] [place], [place]   04/10/2021, 11:15 AM

## 2021-04-17 ENCOUNTER — Encounter: Payer: Self-pay | Admitting: Family Medicine

## 2021-04-17 ENCOUNTER — Other Ambulatory Visit: Payer: Self-pay | Admitting: Family Medicine

## 2021-04-17 MED ORDER — HYDROCOD POLST-CPM POLST ER 10-8 MG/5ML PO SUER: 5.0000 mL | Freq: Two times a day (BID) | ORAL | 0 refills | Status: DC | PRN

## 2021-04-20 ENCOUNTER — Encounter (HOSPITAL_BASED_OUTPATIENT_CLINIC_OR_DEPARTMENT_OTHER): Payer: Self-pay

## 2021-04-20 ENCOUNTER — Emergency Department (HOSPITAL_BASED_OUTPATIENT_CLINIC_OR_DEPARTMENT_OTHER)
Admission: EM | Admit: 2021-04-20 | Discharge: 2021-04-20 | Disposition: A | Discharge status: Home / Self Care | Payer: Self-pay | Attending: Emergency Medicine | Admitting: Emergency Medicine

## 2021-04-20 ENCOUNTER — Other Ambulatory Visit: Payer: Self-pay

## 2021-04-20 DIAGNOSIS — Z79899 Other long term (current) drug therapy: Secondary | ICD-10-CM | POA: Insufficient documentation

## 2021-04-20 DIAGNOSIS — I10 Essential (primary) hypertension: Secondary | ICD-10-CM | POA: Insufficient documentation

## 2021-04-20 DIAGNOSIS — Z7982 Long term (current) use of aspirin: Secondary | ICD-10-CM | POA: Insufficient documentation

## 2021-04-20 DIAGNOSIS — J209 Acute bronchitis, unspecified: Secondary | ICD-10-CM | POA: Insufficient documentation

## 2021-04-20 MED ORDER — DEXAMETHASONE SODIUM PHOSPHATE 10 MG/ML IJ SOLN
10.0000 mg | Freq: Once | INTRAMUSCULAR | Status: AC
  Administered 2021-04-20: 10 mg via INTRAMUSCULAR
  Filled 2021-04-20: qty 1

## 2021-04-20 MED ORDER — ALBUTEROL SULFATE HFA 108 (90 BASE) MCG/ACT IN AERS
2.0000 | INHALATION_SPRAY | RESPIRATORY_TRACT | Status: DC | PRN
  Administered 2021-04-20: 2 via RESPIRATORY_TRACT
  Filled 2021-04-20: qty 6.7

## 2021-04-20 NOTE — ED Triage Notes (Signed)
Pt reports productive cough x 1 week. Light greenish sputum. Recent 1 negative home covid test.

## 2021-04-20 NOTE — ED Provider Notes (Signed)
[facility] Provider Note: [name], MD, FACEP  CSN: [id number] MRN: [id number] ARRIVAL: 04/20/21 at 0232 ROOM: [facility]11/[facility]11   CHIEF COMPLAINT  Cough   HISTORY OF PRESENT ILLNESS  04/20/21 2:47 AM [name] is a 50 y.o. male who has had a cough for about a week.  He denies any fever or nasal congestion but has had some throat discomfort with.  He describes the cough is persistent and worse at night.  He denies feeling short of breath or having wheezing.  He is out of his albuterol inhaler.  He describes the cough is productive of light green sputum.   Past Medical History:  Diagnosis Date   Blood clots in brain    Glaucoma    Hyperlipidemia    Hypertension     Past Surgical History:  Procedure Laterality Date   CARPAL TUNNEL RELEASE     carpel tunnel      No family history on file.  Social History   Tobacco Use   Smoking status: Never   Smokeless tobacco: Never  Substance Use Topics   Alcohol use: Yes    Comment: occ   Drug use: No    Prior to Admission medications   Medication Sig Start Date End Date Taking? Authorizing Provider  amLODipine (NORVASC) 5 MG tablet Take 1 tablet (5 mg total) by mouth daily. 08/29/19 09/28/19  Hoy Register, MD  aspirin EC 81 MG tablet Take 81 mg by mouth daily.    [provider]  chlorpheniramine-HYDROcodone (TUSSIONEX PENNKINETIC ER) 10-8 MG/5ML SUER Take 5 mLs by mouth every 12 (twelve) hours as needed for cough. 04/17/21   Hoy Register, MD  Cholecalciferol (VITAMIN D-3) 5000 UNIT/ML LIQD Vitamin D-3 5000  Vitamin D-3 5000 units  Take 1 capsule daily    [provider]  hydrOXYzine (ATARAX/VISTARIL) 10 MG tablet Take 1 tablet (10 mg total) by mouth every 8 (eight) hours as needed for anxiety. 03/13/21   Hoy Register, MD  meloxicam (MOBIC) 15 MG tablet Take 1 tablet (15 mg total) by mouth daily. 11/26/20 11/26/21  Elson Areas, PA-C  tadalafil (CIALIS) 20 MG tablet tadalafil 20 mg  tablet  Take 1 tablet by oral route as directed.    [provider]    Allergies Patient has no known allergies.   REVIEW OF SYSTEMS  Negative except as noted here or in the History of Present Illness.   PHYSICAL EXAMINATION  Initial Vital Signs Blood pressure 139/90, pulse 66, temperature 98.2 F (36.8 C), temperature source Oral, resp. rate 17, height 5\' 10"  (1.778 m), weight 90.7 kg, SpO2 100 %.  Examination General: Well-developed, well-nourished male in no acute distress; appearance consistent with age of record HENT: normocephalic; atraumatic; no pharyngeal erythema or exudate Eyes: pupils equal, round and reactive to light; extraocular muscles intact Neck: supple Heart: regular rate and rhythm Lungs: Mildly decreased air movement bilaterally; frequent cough Abdomen: soft; nondistended; nontender; bowel sounds present Extremities: No deformity; full range of motion; pulses normal Neurologic: Awake, alert and oriented; motor function intact in all extremities and symmetric; no facial droop Skin: Warm and dry Psychiatric: Normal mood and affect   RESULTS  Summary of this visit's results, reviewed and interpreted by myself:   EKG Interpretation  Date/Time:    Ventricular Rate:    PR Interval:    QRS Duration:   QT Interval:    QTC Calculation:   R Axis:     Text Interpretation:  Laboratory Studies: No results found for this or any previous visit (from the past 24 hour(s)). Imaging Studies: No results found.  ED COURSE and MDM  Nursing notes, initial and subsequent vitals signs, including pulse oximetry, reviewed and interpreted by myself.  Vitals:   04/20/21 0244 04/20/21 0245  BP: 139/90   Pulse: 66   Resp: 17   Temp: 98.2 F (36.8 C)   TempSrc: Oral   SpO2: 100%   Weight:  90.7 kg  Height:  5\' 10"  (1.778 m)   Medications  albuterol (VENTOLIN HFA) 108 (90 Base) MCG/ACT inhaler 2 puff (has no administration in time range)   dexamethasone (DECADRON) injection 10 mg (has no administration in time range)   Will provide patient with an albuterol inhaler and give him a dose of dexamethasone.  He states he gets a similar illness every year about this time.  This may be allergy or weather related.  We will also recommend Mucinex DM as an alternative to the Tussionex he was prescribed 04/17/2021.   PROCEDURES  Procedures   ED DIAGNOSES     ICD-10-CM   1. Acute bronchitis with bronchospasm  J20.9          Collie Kittel, 13/02/2021, MD 04/20/21 [phone]

## 2021-04-20 NOTE — Discharge Instructions (Addendum)
You may get Mucinex DM at the drugstore or grocery store without a prescription.  You may find this works better than the Tussionex you were prescribed recently.

## 2021-07-27 ENCOUNTER — Emergency Department (HOSPITAL_BASED_OUTPATIENT_CLINIC_OR_DEPARTMENT_OTHER): Payer: Self-pay

## 2021-07-27 ENCOUNTER — Encounter (HOSPITAL_BASED_OUTPATIENT_CLINIC_OR_DEPARTMENT_OTHER): Payer: Self-pay | Admitting: Emergency Medicine

## 2021-07-27 ENCOUNTER — Other Ambulatory Visit: Payer: Self-pay

## 2021-07-27 DIAGNOSIS — Z7982 Long term (current) use of aspirin: Secondary | ICD-10-CM | POA: Insufficient documentation

## 2021-07-27 DIAGNOSIS — R072 Precordial pain: Secondary | ICD-10-CM | POA: Insufficient documentation

## 2021-07-27 NOTE — ED Triage Notes (Signed)
Reports pain that goes across the chest from right to center that started this morning.  Denies any n/v/d.  No issues eating or drinking today.  Reports pain is sharp in nature.

## 2021-07-28 ENCOUNTER — Encounter (HOSPITAL_BASED_OUTPATIENT_CLINIC_OR_DEPARTMENT_OTHER): Payer: Self-pay | Admitting: Emergency Medicine

## 2021-07-28 ENCOUNTER — Emergency Department (HOSPITAL_BASED_OUTPATIENT_CLINIC_OR_DEPARTMENT_OTHER): Payer: Self-pay

## 2021-07-28 ENCOUNTER — Emergency Department (HOSPITAL_BASED_OUTPATIENT_CLINIC_OR_DEPARTMENT_OTHER)
Admission: EM | Admit: 2021-07-28 | Discharge: 2021-07-28 | Disposition: A | Discharge status: Home / Self Care | Payer: Self-pay | Attending: Emergency Medicine | Admitting: Emergency Medicine

## 2021-07-28 DIAGNOSIS — R079 Chest pain, unspecified: Secondary | ICD-10-CM

## 2021-07-28 DIAGNOSIS — R072 Precordial pain: Secondary | ICD-10-CM

## 2021-07-28 LAB — TROPONIN I (HIGH SENSITIVITY)
Troponin I (High Sensitivity): 5 ng/L (ref <18)
Troponin I (High Sensitivity): 5 ng/L (ref <18)

## 2021-07-28 LAB — CBC WITH DIFFERENTIAL/PLATELET
Abs Immature Granulocytes: 0.01 10*3/uL (ref 0.00–0.07)
Basophils Absolute: 0 10*3/uL (ref 0.0–0.1)
Basophils Relative: 1 %
Eosinophils Absolute: 0.2 10*3/uL (ref 0.0–0.5)
Eosinophils Relative: 4 %
HCT: 39.5 % (ref 39.0–52.0)
Hemoglobin: 13.4 g/dL (ref 13.0–17.0)
Immature Granulocytes: 0 %
Lymphocytes Relative: 52 %
Lymphs Abs: 2.7 10*3/uL (ref 0.7–4.0)
MCH: 30.5 pg (ref 26.0–34.0)
MCHC: 33.9 g/dL (ref 30.0–36.0)
MCV: 90 fL (ref 80.0–100.0)
Monocytes Absolute: 0.5 10*3/uL (ref 0.1–1.0)
Monocytes Relative: 10 %
Neutro Abs: 1.7 10*3/uL (ref 1.7–7.7)
Neutrophils Relative %: 33 %
Platelets: 271 10*3/uL (ref 150–400)
RBC: 4.39 MIL/uL (ref 4.22–5.81)
RDW: 12.2 % (ref 11.5–15.5)
WBC: 5.2 10*3/uL (ref 4.0–10.5)
nRBC: 0 % (ref 0.0–0.2)

## 2021-07-28 LAB — BASIC METABOLIC PANEL
Anion gap: 6 (ref 5–15)
BUN: 12 mg/dL (ref 6–20)
CO2: 25 mmol/L (ref 22–32)
Calcium: 8.7 mg/dL — ABNORMAL LOW (ref 8.9–10.3)
Chloride: 103 mmol/L (ref 98–111)
Creatinine, Ser: 1.39 mg/dL — ABNORMAL HIGH (ref 0.61–1.24)
GFR, Estimated: >60 mL/min (ref >60)
Glucose, Bld: 100 mg/dL — ABNORMAL HIGH (ref 70–99)
Potassium: 3.5 mmol/L (ref 3.5–5.1)
Sodium: 134 mmol/L — ABNORMAL LOW (ref 135–145)

## 2021-07-28 MED ORDER — ALUM & MAG HYDROXIDE-SIMETH 200-200-20 MG/5ML PO SUSP
30.0000 mL | Freq: Once | ORAL | Status: AC
  Administered 2021-07-28: 30 mL via ORAL
  Filled 2021-07-28: qty 30

## 2021-07-28 NOTE — ED Notes (Signed)
Patient verbalizes understanding of discharge instructions. Opportunity for questioning and answers were provided. Armband removed by staff, pt discharged from ED. Ambulated out to lobby  

## 2021-07-28 NOTE — ED Provider Notes (Signed)
[facility] EMERGENCY DEPARTMENT Provider Note   CSN: [id number] Arrival date & time: 07/27/21  2338     History  Chief Complaint  Patient presents with   Chest Pain    [name] is a 51 y.o. male.   Chest Pain Pain location:  Substernal area Pain quality: sharp   Pain radiates to:  Does not radiate Pain severity:  Moderate Onset quality:  Gradual Duration: hours. Progression:  Waxing and waning (each episode lasts a second) Context: lifting and movement   Relieved by:  Nothing Worsened by:  Nothing Ineffective treatments:  None tried Associated symptoms: no abdominal pain, no anxiety, no back pain, no claudication, no cough, no fatigue, no fever, no headache, no heartburn, no lower extremity edema, no nausea, no near-syncope, no palpitations, no PND and no shortness of breath   Risk factors: male sex       Home Medications Prior to Admission medications   Medication Sig Start Date End Date Taking? Authorizing Provider  amLODipine (NORVASC) 5 MG tablet Take 1 tablet (5 mg total) by mouth daily. 08/29/19 09/28/19  Hoy Register, MD  aspirin EC 81 MG tablet Take 81 mg by mouth daily.    [provider]  chlorpheniramine-HYDROcodone (TUSSIONEX PENNKINETIC ER) 10-8 MG/5ML SUER Take 5 mLs by mouth every 12 (twelve) hours as needed for cough. 04/17/21   Hoy Register, MD  Cholecalciferol (VITAMIN D-3) 5000 UNIT/ML LIQD Vitamin D-3 5000  Vitamin D-3 5000 units  Take 1 capsule daily    [provider]  hydrOXYzine (ATARAX/VISTARIL) 10 MG tablet Take 1 tablet (10 mg total) by mouth every 8 (eight) hours as needed for anxiety. 03/13/21   Hoy Register, MD  meloxicam (MOBIC) 15 MG tablet Take 1 tablet (15 mg total) by mouth daily. 11/26/20 11/26/21  Elson Areas, PA-C  tadalafil (CIALIS) 20 MG tablet tadalafil 20 mg tablet  Take 1 tablet by oral route as directed.    [provider]      Allergies    Patient has no known allergies.     Review of Systems   Review of Systems  Constitutional:  Negative for fatigue and fever.  HENT:  Negative for drooling.   Eyes:  Negative for redness.  Respiratory:  Negative for cough and shortness of breath.   Cardiovascular:  Positive for chest pain. Negative for palpitations, claudication, leg swelling, PND and near-syncope.  Gastrointestinal:  Negative for abdominal pain, heartburn and nausea.  Genitourinary:  Negative for difficulty urinating.  Musculoskeletal:  Negative for back pain.  Neurological:  Negative for headaches.  Psychiatric/Behavioral:  Negative for agitation.   All other systems reviewed and are negative.  Physical Exam Updated Vital Signs BP 134/80    Pulse (!) 47    Temp 98.2 F (36.8 C) (Oral)    Resp 12    Ht 5\' 9"  (1.753 m)    Wt 93 kg    SpO2 100%    BMI 30.27 kg/m  Physical Exam Vitals and nursing note reviewed.  Constitutional:      General: He is not in acute distress.    Appearance: Normal appearance. He is not diaphoretic.  HENT:     Head: Normocephalic and atraumatic.     Nose: Nose normal.  Eyes:     Conjunctiva/sclera: Conjunctivae normal.     Pupils: Pupils are equal, round, and reactive to light.  Cardiovascular:     Rate and Rhythm: Normal rate and regular rhythm.  Pulses: Normal pulses.     Heart sounds: Normal heart sounds.  Pulmonary:     Effort: Pulmonary effort is normal.     Breath sounds: Normal breath sounds.  Abdominal:     General: Abdomen is flat. Bowel sounds are normal.     Palpations: Abdomen is soft.     Tenderness: There is no abdominal tenderness. There is no guarding.  Musculoskeletal:        General: No tenderness. Normal range of motion.     Cervical back: Normal range of motion and neck supple.     Right lower leg: No edema.     Left lower leg: No edema.  Skin:    General: Skin is warm and dry.     Capillary Refill: Capillary refill takes less than 2 seconds.  Neurological:     General: No focal deficit  present.     Mental Status: He is alert and oriented to person, place, and time.     Deep Tendon Reflexes: Reflexes normal.  Psychiatric:        Mood and Affect: Mood normal.        Behavior: Behavior normal.    ED Results / Procedures / Treatments   Labs (all labs ordered are listed, but only abnormal results are displayed) Results for orders placed or performed during the hospital encounter of 07/28/21  CBC with Differential/Platelet  Result Value Ref Range   WBC 5.2 4.0 - 10.5 K/uL   RBC 4.39 4.22 - 5.81 MIL/uL   Hemoglobin 13.4 13.0 - 17.0 g/dL   HCT 95.3 96.7 - 28.9 %   MCV 90.0 80.0 - 100.0 fL   MCH 30.5 26.0 - 34.0 pg   MCHC 33.9 30.0 - 36.0 g/dL   RDW 79.1 50.4 - 13.6 %   Platelets 271 150 - 400 K/uL   nRBC 0.0 0.0 - 0.2 %   Neutrophils Relative % 33 %   Neutro Abs 1.7 1.7 - 7.7 K/uL   Lymphocytes Relative 52 %   Lymphs Abs 2.7 0.7 - 4.0 K/uL   Monocytes Relative 10 %   Monocytes Absolute 0.5 0.1 - 1.0 K/uL   Eosinophils Relative 4 %   Eosinophils Absolute 0.2 0.0 - 0.5 K/uL   Basophils Relative 1 %   Basophils Absolute 0.0 0.0 - 0.1 K/uL   Immature Granulocytes 0 %   Abs Immature Granulocytes 0.01 0.00 - 0.07 K/uL  Basic metabolic panel  Result Value Ref Range   Sodium 134 (L) 135 - 145 mmol/L   Potassium 3.5 3.5 - 5.1 mmol/L   Chloride 103 98 - 111 mmol/L   CO2 25 22 - 32 mmol/L   Glucose, Bld 100 (H) 70 - 99 mg/dL   BUN 12 6 - 20 mg/dL   Creatinine, Ser 4.38 (H) 0.61 - 1.24 mg/dL   Calcium 8.7 (L) 8.9 - 10.3 mg/dL   GFR, Estimated >37 >79 mL/min   Anion gap 6 5 - 15  Troponin I (High Sensitivity)  Result Value Ref Range   Troponin I (High Sensitivity) 5 <18 ng/L  Troponin I (High Sensitivity)  Result Value Ref Range   Troponin I (High Sensitivity) 5 <18 ng/L   DG Chest 2 View  Result Date: 07/28/2021 CLINICAL DATA:  Chest pain. EXAM: CHEST - 2 VIEW COMPARISON:  March 18, 2018 FINDINGS: The heart size and mediastinal contours are within normal  limits. Both lungs are clear. The visualized skeletal structures are unremarkable. IMPRESSION: No active cardiopulmonary disease.  Electronically Signed   By: Aram Candela M.D.   On: 07/28/2021 00:41    None  Radiology DG Chest 2 View  Result Date: 07/28/2021 CLINICAL DATA:  Chest pain. EXAM: CHEST - 2 VIEW COMPARISON:  March 18, 2018 FINDINGS: The heart size and mediastinal contours are within normal limits. Both lungs are clear. The visualized skeletal structures are unremarkable. IMPRESSION: No active cardiopulmonary disease. Electronically Signed   By: Aram Candela M.D.   On: 07/28/2021 00:41    Procedures Procedures    Medications Ordered in ED Medications  alum & mag hydroxide-simeth (MAALOX/MYLANTA) 200-200-20 MG/5ML suspension 30 mL (30 mLs Oral Given 07/28/21 0250)    ED Course/ Medical Decision Making/ A&P                           Medical Decision Making Chest pain multiple episodes lasting seconds with movement at work.  No travel.  No leg pain or swelling not exertional symptoms no associated symptoms   Amount and/or Complexity of Data Reviewed Labs: ordered.    Details: reviewed by me: normal CBC and 2 normal troponins Radiology: ordered and independent interpretation performed.    Details: reviewed by me: normal chest Xray ECG/medicine tests: ordered and independent interpretation performed. Decision-making details documented in ED Course.  Risk OTC drugs. Risk Details: Ruled out for MI in the ED with 2 negative troponins and negative ekg. Heart score is 2, low risk for mace.  Symptoms lasting seconds are not consistent with cardiac etiology.  No leg pain, no SOB no hemoptysis and lasts seconds with movement.  I do not believe this is PE.  This is consistent with MSK pain.      EKG Interpretation  Date/Time:  Saturday July 27 2021 23:48:31 EST Ventricular Rate:  51 PR Interval:  206 QRS Duration: 70 QT Interval:  400 QTC Calculation: 368 R  Axis:   42 Text Interpretation: Sinus bradycardia Confirmed by Nicanor Alcon, Abdiaziz Klahn ([id number]) on 07/28/2021 4:25:25 AM          Final Clinical Impression(s) / ED Diagnoses Final diagnoses:  Chest pain   Return for intractable cough, coughing up blood, fevers > 100.4 unrelieved by medication, shortness of breath, intractable vomiting, chest pain, shortness of breath, weakness, numbness, changes in speech, facial asymmetry, abdominal pain, passing out, Inability to tolerate liquids or food, cough, altered mental status or any concerns. No signs of systemic illness or infection. The patient is nontoxic-appearing on exam and vital signs are within normal limits.  I have reviewed the triage vital signs and the nursing notes. Pertinent labs & imaging results that were available during my care of the patient were reviewed by me and considered in my medical decision making (see chart for details). After history, exam, and medical workup I feel the patient has been appropriately medically screened and is safe for discharge home. Pertinent diagnoses were discussed with the patient. Patient was given return precautions.   Rx / DC Orders ED Discharge Orders     None         Mannat Benedetti, MD 07/28/21 [id number]

## 2021-08-05 ENCOUNTER — Telehealth: Payer: Self-pay | Admitting: Clinical

## 2021-08-05 ENCOUNTER — Ambulatory Visit: Payer: Self-pay | Attending: Family Medicine | Admitting: Family Medicine

## 2021-08-05 ENCOUNTER — Encounter: Payer: Self-pay | Admitting: Family Medicine

## 2021-08-05 ENCOUNTER — Other Ambulatory Visit: Payer: Self-pay

## 2021-08-05 VITALS — BP 139/76 | HR 56 | Ht 68.0 in | Wt 206.2 lb

## 2021-08-05 DIAGNOSIS — I10 Essential (primary) hypertension: Secondary | ICD-10-CM

## 2021-08-05 DIAGNOSIS — F419 Anxiety disorder, unspecified: Secondary | ICD-10-CM

## 2021-08-05 DIAGNOSIS — Z1211 Encounter for screening for malignant neoplasm of colon: Secondary | ICD-10-CM

## 2021-08-05 DIAGNOSIS — R0789 Other chest pain: Secondary | ICD-10-CM

## 2021-08-05 MED ORDER — AMLODIPINE BESYLATE 5 MG PO TABS: 5.0000 mg | ORAL_TABLET | Freq: Every day | ORAL | 6 refills | Status: DC

## 2021-08-05 NOTE — Telephone Encounter (Signed)
I spoke with pt briefly in clinic as he had an appt with Dr. Alvis Lemmings. He mentioned that he was previously bit by a dog. 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 Reports hypervigilance, nightmares, chest tightness, and excessive worrying. Pt appears to have significant trauma. I scheduled him for 08/15/21. He is also requesting to be written out of work until this appointment. LCSW will inform PCP.

## 2021-08-05 NOTE — Progress Notes (Signed)
Subjective:  Patient ID: [name], male    DOB: 1970/07/02  Age: 51 y.o. MRN: [id number]  CC: Hypertension and Anxiety   HPI [name] is a 51 y.o. year old male with a history of hypertension who presents today for follow-up visit.  Interval History: He had an ED visit for precordial chest pain on 07/28/2021 and was ruled out for ACS with negative troponins, unremarkable EKG and patient thought to be musculoskeletal in origin. He has no chest pain at the moment. He had a weight restriction in the Doctor's note he received from the ED to not lift >5lbs. He is wondering if anxiety had to do with his chest pain. He does deliver pets to people's homes and has had an incidence when he got bitten by a dog.  Every time he has to go make deliveries he feels starts to have palpitations and anxiety. Endorses compliance with his antihypertensive. He is wondering if he can receive a doctors note extending his time out of work Past Medical History:  Diagnosis Date   Blood clots in brain    Glaucoma    Hyperlipidemia    Hypertension     Past Surgical History:  Procedure Laterality Date   CARPAL TUNNEL RELEASE     carpel tunnel      History reviewed. No pertinent family history.  No Known Allergies  Outpatient Medications Prior to Visit  Medication Sig Dispense Refill   aspirin EC 81 MG tablet Take 81 mg by mouth daily.     Cholecalciferol (VITAMIN D-3) 5000 UNIT/ML LIQD Vitamin D-3 5000  Vitamin D-3 5000 units  Take 1 capsule daily     hydrOXYzine (ATARAX/VISTARIL) 10 MG tablet Take 1 tablet (10 mg total) by mouth every 8 (eight) hours as needed for anxiety. 60 tablet 1   meloxicam (MOBIC) 15 MG tablet Take 1 tablet (15 mg total) by mouth daily. 30 tablet 2   tadalafil (CIALIS) 20 MG tablet tadalafil 20 mg tablet  Take 1 tablet by oral route as directed.     chlorpheniramine-HYDROcodone (TUSSIONEX PENNKINETIC ER) 10-8 MG/5ML SUER Take 5 mLs by mouth every 12 (twelve) hours as  needed for cough. (Patient not taking: Reported on 08/05/2021) 115 mL 0   amLODipine (NORVASC) 5 MG tablet Take 1 tablet (5 mg total) by mouth daily. 30 tablet 6   No facility-administered medications prior to visit.     ROS Review of Systems  Constitutional:  Negative for activity change and appetite change.  HENT:  Negative for sinus pressure and sore throat.   Eyes:  Negative for visual disturbance.  Respiratory:  Negative for cough, chest tightness and shortness of breath.   Cardiovascular:  Negative for chest pain and leg swelling.  Gastrointestinal:  Negative for abdominal distention, abdominal pain, constipation and diarrhea.  Endocrine: Negative.   Genitourinary:  Negative for dysuria.  Musculoskeletal:  Negative for joint swelling and myalgias.  Skin:  Negative for rash.  Allergic/Immunologic: Negative.   Neurological:  Negative for weakness, light-headedness and numbness.  Psychiatric/Behavioral:  Negative for dysphoric mood and suicidal ideas.    Objective:  BP 139/76    Pulse (!) 56    Ht 5\' 8"  (1.727 m)    Wt 206 lb 3.2 oz (93.5 kg)    SpO2 99%    BMI 31.35 kg/m   BP/Weight 08/05/2021 07/28/2021 [id number]  Systolic BP [id number] -  Diastolic BP 76 80 -  Wt. (Lbs) 206.2 - 205  BMI 31.35 30.27 -  Physical Exam Constitutional:      Appearance: He is well-developed.  Cardiovascular:     Rate and Rhythm: Bradycardia present.     Heart sounds: Normal heart sounds. No murmur heard. Pulmonary:     Effort: Pulmonary effort is normal.     Breath sounds: Normal breath sounds. No wheezing or rales.  Chest:     Chest wall: No tenderness.  Abdominal:     General: Bowel sounds are normal. There is no distension.     Palpations: Abdomen is soft. There is no mass.     Tenderness: There is no abdominal tenderness.  Musculoskeletal:        General: Normal range of motion.     Right lower leg: No edema.     Left lower leg: No edema.  Neurological:     Mental Status: He  is alert and oriented to person, place, and time.  Psychiatric:        Mood and Affect: Mood normal.    CMP Latest Ref Rng & Units 07/27/2021 01/30/2021 09/01/2019  Glucose 70 - 99 mg/dL 100(H) 78 76  BUN 6 - 20 mg/dL 12 11 13   Creatinine 0.61 - 1.24 mg/dL 1.39(H) 1.29(H) 1.30(H)  Sodium 135 - 145 mmol/L 134(L) 140 138  Potassium 3.5 - 5.1 mmol/L 3.5 4.7 3.7  Chloride 98 - 111 mmol/L 103 101 103  CO2 22 - 32 mmol/L 25 27 23   Calcium 8.9 - 10.3 mg/dL 8.7(L) 9.7 9.4  Total Protein 6.0 - 8.5 g/dL - 7.2 7.1  Total Bilirubin 0.0 - 1.2 mg/dL - 0.4 0.3  Alkaline Phos 44 - 121 IU/L - 80 82  AST 0 - 40 IU/L - 22 23  ALT 0 - 44 IU/L - 13 11    Lipid Panel     Component Value Date/Time   CHOL 163 09/01/2019 0930   TRIG 31 09/01/2019 0930   HDL 68 09/01/2019 0930   CHOLHDL 2.4 09/01/2019 0930   CHOLHDL 2.4 09/08/2007 0950   VLDL 5 09/08/2007 0950   LDLCALC 88 09/01/2019 0930    CBC    Component Value Date/Time   WBC 5.2 07/27/2021 2358   RBC 4.39 07/27/2021 2358   HGB 13.4 07/27/2021 2358   HCT 39.5 07/27/2021 2358   PLT 271 07/27/2021 2358   MCV 90.0 07/27/2021 2358   MCH 30.5 07/27/2021 2358   MCHC 33.9 07/27/2021 2358   RDW 12.2 07/27/2021 2358   LYMPHSABS 2.7 07/27/2021 2358   MONOABS 0.5 07/27/2021 2358   EOSABS 0.2 07/27/2021 2358   BASOSABS 0.0 07/27/2021 2358    Lab Results  Component Value Date   HGBA1C  09/08/2007    5.6 (NOTE)   The ADA recommends the following therapeutic goals for glycemic   control related to Hgb A1C measurement:   Goal of Therapy:   < 7.0% Hgb A1C   Action Suggested:  > 8.0% Hgb A1C   Ref:  Diabetes Care, 22, Suppl. 1, 1999    Assessment & Plan:  1. Essential hypertension Controlled Counseled on blood pressure goal of less than 130/80, low-sodium, DASH diet, medication compliance, 150 minutes of moderate intensity exercise per week. Discussed medication compliance, adverse effects. - amLODipine (NORVASC) 5 MG tablet; Take 1 tablet (5 mg  total) by mouth daily.  Dispense: 30 tablet; Refill: 6  2. Screening for colon cancer - Fecal occult blood, imunochemical  3. 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  His best bet might be to search for another job up opportunity     Meds ordered this encounter  Medications   amLODipine (NORVASC) 5 MG tablet    Sig: Take 1 tablet (5 mg total) by mouth daily.    Dispense:  30 tablet    Refill:  6    Follow-up: Return in about 6 months (around 02/02/2022) for Chronic medical conditions.       Charlott Rakes, MD, FAAFP. [facility] and [place], [place]   08/06/2021, 7:52 AM

## 2021-08-06 NOTE — Telephone Encounter (Signed)
Thank you for the update.  I will write the letter for him.

## 2021-08-08 ENCOUNTER — Encounter: Payer: Self-pay | Admitting: Cardiology

## 2021-08-08 ENCOUNTER — Ambulatory Visit (INDEPENDENT_AMBULATORY_CARE_PROVIDER_SITE_OTHER): Payer: Self-pay | Admitting: Cardiology

## 2021-08-08 ENCOUNTER — Other Ambulatory Visit: Payer: Self-pay

## 2021-08-08 VITALS — BP 120/78 | HR 67 | Resp 20 | Ht 69.0 in | Wt 207.9 lb

## 2021-08-08 DIAGNOSIS — I6529 Occlusion and stenosis of unspecified carotid artery: Secondary | ICD-10-CM

## 2021-08-08 DIAGNOSIS — I10 Essential (primary) hypertension: Secondary | ICD-10-CM

## 2021-08-08 DIAGNOSIS — R079 Chest pain, unspecified: Secondary | ICD-10-CM

## 2021-08-08 NOTE — Progress Notes (Signed)
?  ?Cardiology Office Note ? ? ?Date:  08/08/2021  ? ?ID:  [name], DOB 1971/06/02, MRN [id number] ? ?PCP:  Greg Rakes, MD  ?Cardiologist:   Greg Lanum Martinique, MD  ? ?Chief Complaint  ?Patient presents with  ? Chest Pain  ? ? ?  ?History of Present Illness: ?[name] is a 51 y.o. male who is seen at the request of Dr [name] for evaluation of chest pain. He has a history of HTN and HLD. 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 At that time he was noted to have a normal Echo. Carotid dopplers reportedly showed 40-60% stenosis. ? ?More recently he experienced some chest pain one day a couple of weeks ago. It was intermittent throughout the day. Felt sharp and uncomfortable. No SOB, dizziness, sweating. Was seen in the ED. CXR, Ecg and troponins were all normal. Felt to likely be musculoskeletal since he does a lot of heavy lifting with his job with [employer]. He has had no chest pain since then.  ? ? ?Past Medical History:  ?Diagnosis Date  ? Blood clots in brain 2009  ? transvers sinus thrombosis  ? Carotid artery occlusion   ? Glaucoma   ? Hyperlipidemia   ? Hypertension   ? ? ?Past Surgical History:  ?Procedure Laterality Date  ? CARPAL TUNNEL RELEASE    ? carpel tunnel    ? carpel tunnel surgery Right   ? ? ? ?Current Outpatient Medications  ?Medication Sig Dispense Refill  ? amLODipine (NORVASC) 5 MG tablet Take 1 tablet (5 mg total) by mouth daily. 30 tablet 6  ? aspirin EC 81 MG tablet Take 81 mg by mouth daily.    ? chlorpheniramine-HYDROcodone (TUSSIONEX PENNKINETIC ER) 10-8 MG/5ML SUER Take 5 mLs by mouth every 12 (twelve) hours as needed for cough. 115 mL 0  ? Cholecalciferol (VITAMIN D-3) 5000 UNIT/ML LIQD Vitamin D-3 5000 ? Vitamin D-3 5000 units ? Take 1 capsule daily    ? hydrOXYzine (ATARAX/VISTARIL) 10 MG tablet Take 1 tablet (10 mg total) by mouth every 8 (eight) hours as needed for anxiety. 60 tablet 1  ? meloxicam (MOBIC) 15 MG tablet Take 1 tablet (15 mg  total) by mouth daily. 30 tablet 2  ? tadalafil (CIALIS) 20 MG tablet tadalafil 20 mg tablet ? Take 1 tablet by oral route as directed.    ? ?No current facility-administered medications for this visit.  ? ? ?Allergies:   Patient has no known allergies.  ? ? ?Social History:  The patient  reports that he has never smoked. He has never used smokeless tobacco. He reports current alcohol use. He reports that he does not use drugs.  ? ?Family History:  The patient's family history includes Breast cancer in his mother.  ? ? ?ROS:  Please see the history of present illness.   Otherwise, review of systems are positive for none.   All other systems are reviewed and negative.  ? ? ?PHYSICAL EXAM: ?VS:  BP 120/78 (BP Location: Left Arm, Patient Position: Sitting, Cuff Size: Normal)   Pulse 67   Resp 20   Ht 5\' 9"  (1.753 m)   Wt 207 lb 14.4 oz (94.3 kg)   SpO2 97%   BMI 30.70 kg/m?  , BMI Body mass index is 30.7 kg/m?. ?GEN: Well nourished, well developed, in no acute distress ?HEENT: normal ?Neck: no JVD, carotid bruits, or masses ?Cardiac: RRR; no murmurs, rubs, or gallops,no edema  ?Respiratory:  clear to auscultation bilaterally, normal work of breathing ?GI: soft, nontender, nondistended, + BS ?MS: no deformity or atrophy ?Skin: warm and dry, no rash ?Neuro:  Strength and sensation are intact ?Psych: euthymic mood, full affect ? ? ?EKG:  EKG is not ordered today. ?The ekg ordered 07/27/20 demonstrates NSR rate 51. Normal. I have personally reviewed and interpreted this study. ? ? ? ?Recent Labs: ?01/30/2021: ALT 13 ?07/27/2021: BUN 12; Creatinine, Ser 1.39; Hemoglobin 13.4; Platelets 271; Potassium 3.5; Sodium 134  ? ? ?Lipid Panel ?   ?Component Value Date/Time  ? CHOL 163 09/01/2019 0930  ? TRIG 31 09/01/2019 0930  ? HDL 68 09/01/2019 0930  ? CHOLHDL 2.4 09/01/2019 0930  ? CHOLHDL 2.4 09/08/2007 0950  ? VLDL 5 09/08/2007 0950  ? [place] 88 09/01/2019 0930  ? ?  ? ?Wt Readings from Last 3 Encounters:  ?08/08/21 207 lb  14.4 oz (94.3 kg)  ?08/05/21 206 lb 3.2 oz (93.5 kg)  ?07/27/21 205 lb (93 kg)  ?  ? ? ?Other studies Reviewed: ?Additional studies/ records that were reviewed today include: as noted in HPI. ?Review of the above records demonstrates: see above ? ? ?ASSESSMENT AND PLAN: ? ?1.  Chest pain with atypical features. Risk factor of HTN. Last LDL 88. Carotid disease noted in past. Recommend coronary calcium score to help stratify risk. Continue amlodipine and ASA ?2. Borderline hyperlipidemia. CT will help determine if we need to have on statin. ?3. ? Carotid arterial disease. DC summary from 2009 mentions 40-60% stenosis but actual report is not available. Will update dopplers ?4. 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  ? ? ?Current medicines are reviewed at length with the patient today.  The patient does not have concerns regarding medicines. ? ?The following changes have been made:  no change ? ?Labs/ tests ordered today include:  ? ?Orders Placed This Encounter  ?Procedures  ? CT CARDIAC SCORING (SELF PAY ONLY)  ? VAS US CAROTID  ? ? ?   ? ? ?Disposition:   FU with me pending above studies.  ? ?Signed, ?Greg Hangartner Martinique, MD  ?08/08/2021 10:57 AM    ?[facility] ?[address] [place], [place], [address] ?Phone [phone], Fax [phone] ? ? ?

## 2021-08-08 NOTE — Patient Instructions (Signed)
Medication Instructions:  ?Continue same medications ? ? ?Lab Work: ?None ordered ? ? ?Testing/Procedures: ?Schedule Carotid doppler ? ?Coronary Calcium Score ? ? ?Follow-Up: ?At [facility], you and your health needs are our priority.  As part of our continuing mission to provide you with exceptional heart care, we have created designated Provider Care Teams.  These Care Teams include your primary Cardiologist (physician) and Advanced Practice Providers (APPs -  Physician Assistants and Nurse Practitioners) who all work together to provide you with the care you need, when you need it. ? ?We recommend signing up for the patient portal called "MyChart".  Sign up information is provided on this After Visit Summary.  MyChart is used to connect with patients for Virtual Visits (Telemedicine).  Patients are able to view lab/test results, encounter notes, upcoming appointments, etc.  Non-urgent messages can be sent to your provider as well.   ?To learn more about what you can do with MyChart, go to [email or web address].   ? ?Your next appointment:  To Be Determined after test ?  ? ?The format for your next appointment:  Office ? ? ?Provider:  Dr.Jordan ? ? ?

## 2021-08-12 ENCOUNTER — Telehealth: Payer: Self-pay | Admitting: Licensed Clinical Social Worker

## 2021-08-12 NOTE — Telephone Encounter (Signed)
Attempted to reach pt as noted to be uninsured during visit last week w/ Dr. [place]. No answer at [phone], left voicemail requesting call back.  ? ?Octavio Graves, MSW, LCSW ?Clinical Social Worker II ?[place] [facility]/V[facility] Care Navigation  ?[phone]- work cell phone (preferred) ?[phone]- desk phone ? ?

## 2021-08-14 ENCOUNTER — Telehealth: Payer: Self-pay | Admitting: Licensed Clinical Social Worker

## 2021-08-14 NOTE — Progress Notes (Signed)
?Heart and Vascular Care Navigation ? ?08/14/2021 ? ?[name] ?01/20/1971 ?[id number] ? ?Reason for Referral:  ?No insurance, ongoing medical work up ?Engaged with patient by telephone for initial visit for Heart and Vascular Care Coordination. ?                                                                                                  ?Assessment:                                     ?LCSW able to reach pt this morning at [phone]. Introduced self, role, reason for call. Pt confirms that he currently does not have any medical coverage. Confirmed home address, PCP, and emergency contacts. Resides alone, is working full time currently. Acknowledges he has some past due bills on his account. Has access to internet/email at home. Pt interested in any options he may have. I gave brief overview of [employer] and [employer]. Pt may be eligible depending on what his current income is for full or partial discount.  ? ?Pt denies any additional questions/concerns. No other needs around cost of living at this time.  ?HRT/VAS Care Coordination   ? ? Patients Home Cardiology Office [facility]  ? Outpatient Care Team Social Worker  ? Social Worker Name: Esmeralda [facility] [phone]  ? Living arrangements for the past 2 months Apartment  ? Lives with: Self  ? Patient Current Insurance Coverage Self-Pay  ? Patient Has Concern With Paying Medical Bills Yes  ? Patient Concerns With Medical Bills past bills and ongoing medical work up  ? Medical Bill Referrals: CAFA and Orange Card  ? Does Patient Have Prescription Coverage? No  ? Patient Prescription Assistance Programs [place] [facility]  ? [place] Med[facility] Medications mailed ncmedassist app  ? ?  ? ? ?Social History:                                                                             ?SDOH Screenings  ? ?Alcohol Screen: Not on file  ?Depression (PHQ2-9): Low Risk   ? PHQ-2 Score: 0  ?Financial Resource Strain: Medium Risk   ? Difficulty of Paying Living Expenses: Somewhat hard  ?Food Insecurity: No Food Insecurity  ? Worried About Programme researcher, broadcasting/film/video in the Last Year: Never true  ? Ran Out of Food in the Last Year: Never true  ?Housing: Low Risk   ? Last Housing Risk Score: 0  ?Physical Activity: Not on file  ?Social Connections: Not on file  ?Stress: Not on file  ?Tobacco Use: Low Risk   ? Smoking Tobacco Use: Never  ? Smokeless Tobacco Use: Never  ? Passive Exposure: Not on file  ?Transportation Needs:  No Transportation Needs  ? Lack of Transportation (Medical): No  ? Lack of Transportation (Non-Medical): No  ? ? ?SDOH Interventions: ?Financial Resources:  Financial Strain Interventions: Other (Comment), Artist (referral for CAFA and Orange Card/NCMedAssist) ?Financial Counseling for [employer] Program  ?Food Insecurity:  Food Insecurity Interventions: Intervention Not Indicated  ?Housing Insecurity:  Housing Interventions: Intervention Not Indicated  ?Transportation:   Transportation Interventions: Intervention Not Indicated  ? ? ?Other Care Navigation Interventions:    ? ?Provided Pharmacy assistance resources [place] [facility]  ? ?Follow-up plan:   ?Mailed pt NCMedAssist, [facility] Financial Assistance and [employer]. Included my card and encouraged pt to f/u as needed. Once applications completed can make an appt w/ Mikle Bosworth, financial counselor to submit applications.  ? ? ? ?

## 2021-08-15 ENCOUNTER — Other Ambulatory Visit: Payer: Self-pay

## 2021-08-15 ENCOUNTER — Ambulatory Visit (INDEPENDENT_AMBULATORY_CARE_PROVIDER_SITE_OTHER): Payer: Self-pay | Admitting: Clinical

## 2021-08-15 DIAGNOSIS — F40218 Other animal type phobia: Secondary | ICD-10-CM

## 2021-08-16 NOTE — BH Specialist Note (Signed)
Integrated Behavioral Health Initial In-Person Visit ? ?MRN: [id number] ?Name: [name] ? ?Number of [place] Clinician visits: 1- Initial Visit ? ?Session Start time: 1410 ?   ?Session End time: 1500 ? ?Total time in minutes: 50 ? ? ?Types of Service: Individual psychotherapy ? ?Interpretor:No. Interpretor Name and Language: N/A ? ? Warm Hand Off Completed. ?  ? ?  ? ? ?Subjective: ?[name] is a 51 y.o. 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 ?Patient reports the following symptoms/concerns: Reports feeling anxious when thinking about dogs. Reports that he was severely bitten by a dog four years ago while working. 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 Reports that he is on edge any time he sees or hears a dog. Reports that he is hypervigilant when he approaches homes that may have dogs. Reports heart racing and fidgeting when he is startled by a dog. Reports difficulty working since this incident occurred four years ago as he is still an Surveyor, quantity. ?Duration of problem: 4 years; Severity of problem: moderate ? ?Objective: ?Mood: Anxious and Affect: Appropriate ?Risk of harm to self or others: No plan to harm self or others ? ?Life Context: ?Family and Social: Pt has support. ?School/Work: Pt is employed by [employer]. ?Self-Care: No substance use. ?Life Changes: Pt was bitten by a dog in 2019 and it continues to impact him. ? ?Patient and/or Family's Strengths/Protective Factors: ?Social and Emotional competence ? ?Goals Addressed: ?Patient will: ?Reduce symptoms of: anxiety ?Increase knowledge and/or ability of: coping skills  ?Demonstrate ability to: Increase healthy adjustment to current life circumstances ? ?Progress towards Goals: ?Ongoing ? ?Interventions: ?Interventions utilized: CBT Cognitive Behavioral Therapy and Supportive Counseling  ?Standardized Assessments  completed: GAD-7 and PHQ 2 ?GAD 7 : Generalized Anxiety Score 08/15/2021 01/30/2021 08/29/2019  ?Nervous, Anxious, on Edge 1 0 0  ?Control/stop worrying 0 0 0  ?Worry too much - different things 1 0 0  ?Trouble relaxing 0 0 0  ?Restless 1 0 0  ?Easily annoyed or irritable 0 0 0  ?Afraid - awful might happen 0 0 0  ?Total GAD 7 Score 3 0 0  ? ?  ?[place] from 08/15/2021 in [place]  ?PHQ-2 Total Score 0  ? ?  ?  ?Patient and/or Family Response: Pt receptive to tx. Pt receptive to psychoeducation on trauma, anxiety, and phobias. Pt receptive to cognitive restructuring to decrease negative thoughts. Pt receptive to assistance with cognitive processing skills. Pt receptive to utilizing deep breathing and grounding techniques. ? ?Patient Centered Plan: ?Patient is on the following Treatment Plan(s):  Anxiety ? ?Assessment: ?Denies SI/HI. 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 Pt appears to have experienced trauma related to being bitten by a dog four years ago. Pt is having difficulty working and frequently worries he is going to come in contact with dogs. ?  ?Patient may benefit from therapy. LCSW provided pt information on integrated care and outpatient therapy. Pt made aware of brief interventions provided however pt still requests brief interventions instead of outpatient therapy. LCSW provided psychoeducation on anxiety, trauma, and phobia. LCSW utilized Adult nurse. LCSW encouraged pt to utilize deep breathing and grounding techniques. ? ?Plan: ?Follow up with behavioral health clinician on : 08/29/21 ?Behavioral recommendations: Utilize deep breathing and grounding techniques ?Referral(s): [place] (In Clinic) ?"From scale of  1-10, how likely are you to follow plan?": 10 ? ?Melva Faux C Raza Bayless, LCSW ? ? ? ? ? ? ? ? ?

## 2021-08-27 ENCOUNTER — Other Ambulatory Visit: Payer: Self-pay | Admitting: Cardiology

## 2021-08-27 DIAGNOSIS — I6529 Occlusion and stenosis of unspecified carotid artery: Secondary | ICD-10-CM

## 2021-08-29 ENCOUNTER — Ambulatory Visit (INDEPENDENT_AMBULATORY_CARE_PROVIDER_SITE_OTHER): Payer: Self-pay | Admitting: Clinical

## 2021-08-29 ENCOUNTER — Other Ambulatory Visit: Payer: Self-pay

## 2021-08-29 ENCOUNTER — Telehealth: Payer: Self-pay | Admitting: Family Medicine

## 2021-08-29 DIAGNOSIS — F40218 Other animal type phobia: Secondary | ICD-10-CM

## 2021-08-29 NOTE — Telephone Encounter (Signed)
I spoke with pt and he mentioned that he does not need to push it back but he may just be running a few minutes behind. ?

## 2021-08-29 NOTE — Telephone Encounter (Signed)
Copied from CRM [id number]. Topic: General - Other ?>> Aug 29, 2021  8:40 AM Jaquita Rector A wrote: ?Reason for CRM: Patient called in asking Ms Massie Maroon to please call him he may need to set his appointment for a little later today since his work schedule may not permit him to come in at 2 PM. Asking for a call back ASAP please at Ph# [phone] ?

## 2021-09-02 ENCOUNTER — Ambulatory Visit (INDEPENDENT_AMBULATORY_CARE_PROVIDER_SITE_OTHER)
Admission: RE | Admit: 2021-09-02 | Discharge: 2021-09-02 | Disposition: A | Discharge status: Home / Self Care | Payer: Self-pay | Source: Ambulatory Visit | Attending: Cardiology | Admitting: Cardiology

## 2021-09-02 ENCOUNTER — Other Ambulatory Visit: Payer: Self-pay

## 2021-09-02 ENCOUNTER — Ambulatory Visit (HOSPITAL_COMMUNITY)
Admission: RE | Admit: 2021-09-02 | Discharge: 2021-09-02 | Disposition: A | Discharge status: Home / Self Care | Payer: Self-pay | Source: Ambulatory Visit | Attending: Cardiovascular Disease | Admitting: Cardiovascular Disease

## 2021-09-02 DIAGNOSIS — I6522 Occlusion and stenosis of left carotid artery: Secondary | ICD-10-CM

## 2021-09-02 DIAGNOSIS — R079 Chest pain, unspecified: Secondary | ICD-10-CM

## 2021-09-02 DIAGNOSIS — I10 Essential (primary) hypertension: Secondary | ICD-10-CM

## 2021-09-02 DIAGNOSIS — I6529 Occlusion and stenosis of unspecified carotid artery: Secondary | ICD-10-CM | POA: Insufficient documentation

## 2021-09-04 NOTE — BH Specialist Note (Signed)
Integrated Behavioral Health Follow Up In-Person Visit ? ?MRN: [id number] ?Name: [name] ? ?Number of Integrated Behavioral Health Clinician visits: 2- Second Visit ? ?Session Start time: 1420 ?  ?Session End time: 1500 ? ?Total time in minutes: 40 ? ? ?Types of Service: Individual psychotherapy ? ?Interpretor:No. Interpretor Name and Language: N/A ? ?Subjective: ?[name] is a 51 y.o. 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 ?Patient reports the following symptoms/concerns: Reports that he returned to work at [employer]. Reports that he still worries that there will be a dog when approaching homes. Reports that he has been utilizing grounding techniques to assist with working. Reports that his supervisor changed his route to a less familiar area. ?Duration of problem: 4 years; Severity of problem: moderate ? ?Objective: ?Mood: Anxious and Affect: Appropriate ?Risk of harm to self or others: No plan to harm self or others ? ?Life Context: ?Family and Social: Pt has support. 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 ?Self-Care: No substance use. ?Life Changes: Pt was bitten by a dog in 2019 and it continues to impact him. ?(No changes to life context) ? ?Patient and/or Family's Strengths/Protective Factors: ?Social and Emotional competence ? ?Goals Addressed: ?Patient will: ? Reduce symptoms of: anxiety  ? Increase knowledge and/or ability of: coping skills  ? Demonstrate ability to: Increase healthy adjustment to current life circumstances ? ?Progress towards Goals: ?Ongoing ? ?Interventions: ?Interventions utilized:  Copywriter, advertising, CBT Cognitive Behavioral Therapy, and Supportive Counseling ?Standardized Assessments completed: Not Needed ? ?Patient and/or Family Response: Pt receptive to continuing grounding techniques and deep breathing. Pt receptive to cognitive restructuring. ? ?Patient Centered Plan: ?Patient is on the  following Treatment Plan(s): Anxiety ? ?Assessment: ?Denies SI/HI. 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 Pt is utilizing healthy coping skills to assist with him working. Pt continues to be on edge at times when he hears or sees a dog at work or at home.  ? ?Patient may benefit from CBT. LCSW provided psychoeducation on phobias. LCSW utilized Chartered certified accountant. LCSW encouraged pt to utilize grounding techniques. LCSW will fu with pt. ? ?Plan: ?Follow up with behavioral health clinician on : 09/19/21 ?Behavioral recommendations: Continue grounding techniques and deep breathing. ?Referral(s): Integrated [employer] (In Clinic) ?"From scale of 1-10, how likely are you to follow plan?": 10 ? ?Mccade Sullenberger C Vi Biddinger, LCSW ? ? ?

## 2021-09-09 ENCOUNTER — Other Ambulatory Visit: Payer: Self-pay | Admitting: Family Medicine

## 2021-09-09 ENCOUNTER — Encounter: Payer: Self-pay | Admitting: Family Medicine

## 2021-09-09 DIAGNOSIS — N5089 Other specified disorders of the male genital organs: Secondary | ICD-10-CM

## 2021-09-18 ENCOUNTER — Other Ambulatory Visit (HOSPITAL_COMMUNITY): Payer: Self-pay

## 2021-09-19 ENCOUNTER — Ambulatory Visit (INDEPENDENT_AMBULATORY_CARE_PROVIDER_SITE_OTHER): Payer: Self-pay | Admitting: Clinical

## 2021-09-19 DIAGNOSIS — F40218 Other animal type phobia: Secondary | ICD-10-CM

## 2021-09-19 NOTE — BH Specialist Note (Signed)
Integrated Behavioral Health Follow Up In-Person Visit ? ?MRN: [id number] ?Name: [name] ? ?Number of Integrated Behavioral Health Clinician visits: 3- Third Visit ? ?Session Start time: 1603 ?  ?Session End time: 1640 ? ?Total time in minutes: 37 ? ? ?Types of Service: Individual psychotherapy ? ?Interpretor:No. Interpretor Name and Language: N/A ? ?Subjective: ?[name] is a 51 y.o. 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 Reports that he still feels on edge when he sees larger dogs and also when they are on leashes. 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  ?Duration of problem: 4 years; Severity of problem: moderate ? ?Objective: ?Mood: Anxious and Affect: Appropriate ?Risk of harm to self or others: No plan to harm self or others ? ?Life Context: ?Family and Social: Pt has support. 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 ?Self-Care: No substance use. ?Life Changes: Pt was bitten by a dog in 2019 and it continues to impact him ?(No changes to life context) ? ?Patient and/or Family's Strengths/Protective Factors: ?Social and Emotional competence ? ?Goals Addressed: ?Patient will: ? Reduce symptoms of: anxiety  ? Increase knowledge and/or ability of: coping skills  ? Demonstrate ability to: Increase healthy adjustment to current life circumstances ? ?Progress towards Goals: ?Ongoing ? ?Interventions: ?Interventions utilized:  Copywriter, advertising, CBT Cognitive Behavioral Therapy, and Supportive Counseling ?Standardized Assessments completed: Not Needed ? ?Patient and/or Family Response: Pt receptive to tx. Pt receptive to cognitive restructuring. Pt receptive to  psychoeducation provided on anxiety related to dogs. Pt receptive to continuing deep breathing and additional grounding techniques.  ? ?Patient Centered Plan: ?Patient is on the following Treatment Plan(s): Anxiety ? ?Assessment: ?Denies SI/HI. 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 Pt appears to be utilizing healthy coping skills to manage his anxiety while working. Pt's cognitive processing skills appear to be improving.  ? ?Patient may benefit from continuing grounding techniques and deep breathing. LCSW provided psychoeducation on anxiety related to dogs. LCSW utilized cognitive restructuring to decrease negative thoughts. LCSW encouraged pt to continue utilizing grounding techniques and deep breathing. LCSW made pt aware of her departure and processed this with pt. LCSW will provide pt with resources for therapy. ? ?Plan: ?Follow up with behavioral health clinician on : 09/26/21 ?Behavioral recommendations: Continue deep breathing and grounding techniques. ?Referral(s): Integrated Art gallery manager (In Clinic) and [facility] (LME/Outside Clinic) ?"From scale of 1-10, how likely are you to follow plan?": 10 ? ?Owen Pratte C Amor Packard, LCSW ? ? ? ?

## 2021-09-20 ENCOUNTER — Ambulatory Visit (HOSPITAL_COMMUNITY)
Admission: RE | Admit: 2021-09-20 | Discharge: 2021-09-20 | Disposition: A | Discharge status: Home / Self Care | Payer: Self-pay | Source: Ambulatory Visit | Attending: Family Medicine | Admitting: Family Medicine

## 2021-09-20 DIAGNOSIS — N5089 Other specified disorders of the male genital organs: Secondary | ICD-10-CM | POA: Insufficient documentation

## 2021-09-23 ENCOUNTER — Other Ambulatory Visit: Payer: Self-pay | Admitting: Family Medicine

## 2021-09-23 DIAGNOSIS — I861 Scrotal varices: Secondary | ICD-10-CM

## 2021-09-25 ENCOUNTER — Telehealth: Payer: Self-pay | Admitting: Licensed Clinical Social Worker

## 2021-09-25 NOTE — Telephone Encounter (Signed)
Called pt to f/u on assistance applications mailed to him since no response to [employer]. ?Pt answered at [phone]. Shares he isnt sure if he received anything, will check this afternoon. ?I shared that it had been sent at least a month ago, so if still not received to let me know and I will place new ones in the mail. ?Pt will check and let this writer know if I need to send new copies. I remain available. ? ?Octavio Graves, MSW, LCSW ?Clinical Social Worker II ?[place] [facility]/Vascular Care Navigation  ?[phone]- work cell phone (preferred) ?[phone]- desk phone ? ?

## 2021-09-26 ENCOUNTER — Encounter: Payer: Self-pay | Admitting: *Deleted

## 2021-09-26 ENCOUNTER — Encounter: Payer: Self-pay | Admitting: Family Medicine

## 2021-09-26 ENCOUNTER — Ambulatory Visit (INDEPENDENT_AMBULATORY_CARE_PROVIDER_SITE_OTHER): Payer: Self-pay | Admitting: Clinical

## 2021-10-04 ENCOUNTER — Telehealth: Payer: Self-pay | Admitting: Clinical

## 2021-10-04 NOTE — Telephone Encounter (Signed)
I attempted to call pt, to provide him with resources. No answer, left vm. Sent resources to his mychart. ?

## 2021-10-11 ENCOUNTER — Telehealth: Payer: Self-pay | Admitting: Licensed Clinical Social Worker

## 2021-10-11 NOTE — Telephone Encounter (Signed)
LCSW reached out to pt via telephone at [phone]. ?No answer, message left on voicemail f/u inquiring about assistance applications- pt wasn't sure if received or not. I will re-attempt as able.  ? ?Octavio Graves, MSW, LCSW ?Clinical Social Worker II ?[place] [facility]/V[facility] Care Navigation  ?[phone]- work cell phone (preferred) ?[phone]- desk phone ? ?

## 2021-10-17 ENCOUNTER — Telehealth: Payer: Self-pay | Admitting: Licensed Clinical Social Worker

## 2021-10-17 NOTE — Telephone Encounter (Signed)
Able to reach pt this morning at [phone], re-introduced self, role, reason for call. Pt shares that he has not yet had time to see if he has received the applications, he requests they be mailed again. I will do so and f/u with him in early June. Pt agreeable.  ? ?Octavio Graves, MSW, LCSW ?Clinical Social Worker II ?[place] [facility]/Vascular Care Navigation  ?[phone]- work cell phone (preferred) ?[phone]- desk phone ? ?

## 2021-10-21 ENCOUNTER — Ambulatory Visit (INDEPENDENT_AMBULATORY_CARE_PROVIDER_SITE_OTHER): Payer: Self-pay | Admitting: Urology

## 2021-10-21 ENCOUNTER — Encounter: Payer: Self-pay | Admitting: Urology

## 2021-10-21 VITALS — BP 166/80 | HR 48 | Ht 69.0 in | Wt 210.0 lb

## 2021-10-21 DIAGNOSIS — R3129 Other microscopic hematuria: Secondary | ICD-10-CM

## 2021-10-21 DIAGNOSIS — I861 Scrotal varices: Secondary | ICD-10-CM

## 2021-10-21 DIAGNOSIS — N4341 Spermatocele of epididymis, single: Secondary | ICD-10-CM

## 2021-10-21 LAB — URINALYSIS, COMPLETE
Bilirubin, UA: NEGATIVE
Glucose, UA: NEGATIVE
Ketones, UA: NEGATIVE
Leukocytes,UA: NEGATIVE
Nitrite, UA: NEGATIVE
Protein,UA: NEGATIVE
Specific Gravity, UA: 1.02 (ref 1.005–1.030)
Urobilinogen, Ur: 0.2 mg/dL (ref 0.2–1.0)
pH, UA: 6.5 (ref 5.0–7.5)

## 2021-10-21 LAB — MICROSCOPIC EXAMINATION: Bacteria, UA: NONE SEEN

## 2021-10-21 NOTE — Progress Notes (Signed)
10/21/2021 10:17 AM   [name] [birth year] [id number]  Referring provider: Charlott Rakes, MD [place],  [place] [address]  Chief Complaint  Patient presents with   Other    HPI: [name] is a 51 y.o. male referred for evaluation of a varicocele.  States he saw PCP recently and found to have a left hemiscrotal mass on exam and scrotal ultrasound was recommended Scrotal ultrasound performed 09/20/2021 remarkable for a 1.2 cm left spermatocele.  Radiologist commented on trace bilateral hydroceles and small bilateral varicoceles L >R Denies scrotal pain No bothersome LUTS Denies dysuria, gross hematuria Had UAs in 2022 which showed pyuria/microhematuria/bacteriuria.  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 If you have any questions, ask your nurse or doctor.          STOP taking these medications    chlorpheniramine-HYDROcodone 10-8 MG/5ML Suer Commonly known as: Tussionex Pennkinetic ER Stopped by: Abbie Sons, MD   hydrOXYzine 10 MG tablet Commonly known as: ATARAX Stopped by: Abbie Sons, MD       TAKE these medications    amLODipine 5 MG tablet Commonly known as: NORVASC Take 1 tablet (5 mg total) by mouth daily.   aspirin EC 81 MG tablet Take 81 mg by mouth daily.   meloxicam 15 MG tablet Commonly known as: MOBIC Take 1 tablet (15 mg total) by mouth daily.   tadalafil 20 MG tablet Commonly known as: CIALIS tadalafil 20 mg tablet  Take 1 tablet  by oral route as directed.   Vitamin D-3 5000 UNIT/ML Liqd Vitamin D-3 5000  Vitamin D-3 5000 units  Take 1 capsule daily        Allergies: No Known Allergies  Family History: Family History  Problem Relation Age of Onset   Breast cancer Mother     Social History:  reports that he has never smoked. He has never used smokeless tobacco. He reports current alcohol use. He reports that he does not use drugs.   Physical Exam: BP (!) 166/80   Pulse (!) 48   Ht 5\' 9"  (1.753 m)   Wt 210 lb (95.3 kg)   BMI 31.01 kg/m   Constitutional:  Alert and oriented, No acute distress. HEENT: [place] AT, moist mucus membranes.  Trachea midline, no masses. Cardiovascular: No clubbing, cyanosis, or edema. Respiratory: Normal respiratory effort, no increased work of breathing. GI: Abdomen is soft, nontender, nondistended, no abdominal masses GU: Phallus without lesions.  Testes descended bilaterally without masses or tenderness.  Small ~ 1 cm spermatocele left globus major.  Nontender.  No palpable hydroceles or varicoceles. Skin: No rashes, bruises or suspicious lesions. Neurologic: Grossly intact, no focal deficits, moving all 4 extremities. Psychiatric: Normal mood and affect.  Laboratory Data:  Urinalysis Micro 3-10 RBC   Assessment & Plan:    1.  Left spermatocele Patient asymptomatic  and no treatment needed Does not have clinical hydroceles or varicoceles  2.  Microhematuria Previous UAs with microhematuria/pyuria UA today with microhematuria Reviewed with patient prior to leaving issues UA did show microhematuria would recommend a CT urogram Order was entered and will call with results If CT shows no significant abnormalities would recommend cystoscopy   Abbie Sons, MD  [place] [address] [place], [place] [address] [phone]

## 2021-10-25 ENCOUNTER — Encounter: Payer: Self-pay | Admitting: Urology

## 2021-11-08 ENCOUNTER — Ambulatory Visit (HOSPITAL_COMMUNITY)
Admission: RE | Admit: 2021-11-08 | Discharge: 2021-11-08 | Disposition: A | Discharge status: Home / Self Care | Payer: Self-pay | Source: Ambulatory Visit | Attending: Urology | Admitting: Urology

## 2021-11-08 DIAGNOSIS — R3129 Other microscopic hematuria: Secondary | ICD-10-CM | POA: Insufficient documentation

## 2021-11-08 MED ORDER — IOHEXOL 300 MG/ML  SOLN
100.0000 mL | Freq: Once | INTRAMUSCULAR | Status: AC | PRN
  Administered 2021-11-08: 100 mL via INTRAVENOUS

## 2021-11-08 MED ORDER — SODIUM CHLORIDE (PF) 0.9 % IJ SOLN
INTRAMUSCULAR | Status: AC
  Filled 2021-11-08: qty 50

## 2021-11-08 MED ORDER — SODIUM CHLORIDE (PF) 0.9 % IJ SOLN
INTRAMUSCULAR | Status: AC
  Filled 2021-11-08: qty 100

## 2021-11-11 ENCOUNTER — Telehealth: Payer: Self-pay | Admitting: *Deleted

## 2021-11-11 NOTE — Telephone Encounter (Signed)
-----   Message from Abbie Sons, MD sent at 11/11/2021  9:15 AM EDT ----- No findings on CT that would explain microhematuria.  Since CT imaging does not fully evaluate the bladder recommend scheduling cystoscopy to make sure he has no evidence of bladder cancer.  Please schedule.

## 2021-11-11 NOTE — Telephone Encounter (Signed)
Notified patient as instructed, patient pleased. Discussed follow-up appointments, patient agrees  

## 2021-11-13 ENCOUNTER — Telehealth: Payer: Self-pay | Admitting: Licensed Clinical Social Worker

## 2021-11-13 NOTE — Telephone Encounter (Signed)
LCSW sent pt a text message to f/u on applications sent to pt [employer], [employer] and [employer]), pt responded to confirm they have been received and he is working on them at this time. I remain available for any questions or assistance needed.   [name], MSW, [place]  [phone]- work cell phone (preferred) [phone]- desk phone

## 2021-12-03 ENCOUNTER — Encounter: Payer: Self-pay | Admitting: Family Medicine

## 2021-12-05 ENCOUNTER — Other Ambulatory Visit: Payer: Self-pay | Admitting: Urology

## 2021-12-30 ENCOUNTER — Other Ambulatory Visit: Payer: Self-pay | Admitting: Urology

## 2022-01-27 ENCOUNTER — Encounter: Payer: Self-pay | Admitting: Urology

## 2022-01-27 ENCOUNTER — Other Ambulatory Visit: Payer: Self-pay | Admitting: Urology

## 2022-02-04 ENCOUNTER — Ambulatory Visit: Payer: Self-pay | Admitting: Family Medicine

## 2022-05-31 ENCOUNTER — Emergency Department (HOSPITAL_BASED_OUTPATIENT_CLINIC_OR_DEPARTMENT_OTHER): Payer: Self-pay

## 2022-05-31 ENCOUNTER — Encounter (HOSPITAL_BASED_OUTPATIENT_CLINIC_OR_DEPARTMENT_OTHER): Payer: Self-pay | Admitting: Emergency Medicine

## 2022-05-31 ENCOUNTER — Other Ambulatory Visit: Payer: Self-pay

## 2022-05-31 ENCOUNTER — Emergency Department (HOSPITAL_BASED_OUTPATIENT_CLINIC_OR_DEPARTMENT_OTHER)
Admission: EM | Admit: 2022-05-31 | Discharge: 2022-05-31 | Disposition: A | Discharge status: Home / Self Care | Payer: Self-pay | Attending: Emergency Medicine | Admitting: Emergency Medicine

## 2022-05-31 DIAGNOSIS — Z1152 Encounter for screening for COVID-19: Secondary | ICD-10-CM | POA: Insufficient documentation

## 2022-05-31 DIAGNOSIS — R051 Acute cough: Secondary | ICD-10-CM

## 2022-05-31 DIAGNOSIS — J101 Influenza due to other identified influenza virus with other respiratory manifestations: Secondary | ICD-10-CM

## 2022-05-31 LAB — RESP PANEL BY RT-PCR (RSV, FLU A&B, COVID)  RVPGX2
Influenza A by PCR: POSITIVE — AB
Influenza B by PCR: NEGATIVE
Resp Syncytial Virus by PCR: NEGATIVE
SARS Coronavirus 2 by RT PCR: NEGATIVE

## 2022-05-31 MED ORDER — BENZONATATE 100 MG PO CAPS: 100.0000 mg | ORAL_CAPSULE | Freq: Three times a day (TID) | ORAL | 0 refills | Status: DC

## 2022-05-31 NOTE — ED Provider Notes (Signed)
[facility] EMERGENCY DEPARTMENT Provider Note   CSN: [id number] Arrival date & time: 05/31/22  1118     History  Chief Complaint  Patient presents with   Cough    [name] is a 51 y.o. male.  Patient presents the emergency department complaining of a cough which began on Tuesday with chest congestion.  Patient denies any known fevers, body aches, chills, chest pain, shortness of breath, headache.  Past medical history significant for hypertension, previous carotid artery occlusion  HPI     Home Medications Prior to Admission medications   Medication Sig Start Date End Date Taking? Authorizing Provider  benzonatate (TESSALON) 100 MG capsule Take 1 capsule (100 mg total) by mouth every 8 (eight) hours. 05/31/22  Yes Barrie Dunker B, PA-C  amLODipine (NORVASC) 5 MG tablet Take 1 tablet (5 mg total) by mouth daily. 08/05/21 09/04/21  Hoy Register, MD  aspirin EC 81 MG tablet Take 81 mg by mouth daily.    [provider]  Cholecalciferol (VITAMIN D-3) 5000 UNIT/ML LIQD Vitamin D-3 5000  Vitamin D-3 5000 units  Take 1 capsule daily    [provider]  tadalafil (CIALIS) 20 MG tablet tadalafil 20 mg tablet  Take 1 tablet by oral route as directed.    [provider]      Allergies    Patient has no known allergies.    Review of Systems   Review of Systems  Constitutional:  Negative for fever.  HENT:  Negative for sore throat.   Respiratory:  Positive for cough. Negative for shortness of breath.   Cardiovascular:  Negative for chest pain.  Musculoskeletal:  Negative for myalgias.    Physical Exam Updated Vital Signs BP 126/87 (BP Location: Right Arm)   Pulse 87   Temp 99.1 F (37.3 C) (Oral)   Resp 18   Ht 5\' 9"  (1.753 m)   Wt 90.7 kg   SpO2 100%   BMI 29.53 kg/m  Physical Exam Vitals and nursing note reviewed.  Constitutional:      General: He is not in acute distress.    Appearance: He is well-developed.  HENT:      Head: Normocephalic and atraumatic.  Eyes:     Conjunctiva/sclera: Conjunctivae normal.  Cardiovascular:     Rate and Rhythm: Normal rate and regular rhythm.     Heart sounds: No murmur heard. Pulmonary:     Effort: Pulmonary effort is normal. No respiratory distress.     Breath sounds: Rhonchi (Scattered) present.  Abdominal:     Palpations: Abdomen is soft.     Tenderness: There is no abdominal tenderness.  Musculoskeletal:        General: No swelling.     Cervical back: Neck supple.  Skin:    General: Skin is warm and dry.     Capillary Refill: Capillary refill takes less than 2 seconds.  Neurological:     Mental Status: He is alert.  Psychiatric:        Mood and Affect: Mood normal.     ED Results / Procedures / Treatments   Labs (all labs ordered are listed, but only abnormal results are displayed) Labs Reviewed  RESP PANEL BY RT-PCR (RSV, FLU A&B, COVID)  RVPGX2 - Abnormal; Notable for the following components:      Result Value   Influenza A by PCR POSITIVE (*)    All other components within normal limits    EKG None  Radiology DG Chest 2  View  Result Date: 05/31/2022 CLINICAL DATA:  Cough and congestion EXAM: CHEST - 2 VIEW COMPARISON:  Chest x-ray dated July 28, 2021 FINDINGS: Cardiac and mediastinal contours are within normal limits. Lungs are clear. Cardiac and mediastinal contours are within normal limits. No pleural effusion or evidence of pneumothorax IMPRESSION: No active cardiopulmonary disease. Electronically Signed   By: Allegra Lai M.D.   On: 05/31/2022 11:35    Procedures Procedures    Medications Ordered in ED Medications - No data to display  ED Course/ Medical Decision Making/ A&P                           Medical Decision Making Amount and/or Complexity of Data Reviewed Radiology: ordered.   Patient presents with a chief complaint of cough and congestion.  Differential diagnosis includes but is not limited to COVID-19,  influenza, RSV, pneumonia, and others  The patient has comorbidities including hypertension  I reviewed the patient's medical history including chart review.  This included notes from cardiology from March of this year documenting chest pain of uncertain etiology  I ordered and reviewed labs.  Pertinent results include positive influenza A result.  I ordered and interpreted imaging including a chest x-ray.  The chest x-ray showed no active cardiopulmonary disease.  I agree with radiologist findings  Plan to discharge patient home at this time.  Patient does have a positive influenza test result which would coincide with his cough and congestion.  Patient is outside of the window at this time for Tamiflu.  I will prescribe Tessalon Perles to help with his cough.  Patient instructed on supportive care at home.        Final Clinical Impression(s) / ED Diagnoses Final diagnoses:  Influenza A  Acute cough    Rx / DC Orders ED Discharge Orders          Ordered    benzonatate (TESSALON) 100 MG capsule  Every 8 hours        05/31/22 1328              Pamala Duffel 05/31/22 1329    Vanetta Mulders, MD 06/02/22 1712

## 2022-05-31 NOTE — Discharge Instructions (Signed)
You were diagnosed today with influenza.  I have prescribed cough medication which is to be taken as directed.  You may take over-the-counter medication such as acetaminophen or ibuprofen as needed for fever and pain control.  Please note the cough from this may linger for a few weeks or more.  If you develop shortness of breath or other life-threatening conditions please return to the emergency department, otherwise follow-up with your primary care provider as needed

## 2022-05-31 NOTE — ED Triage Notes (Signed)
Patient reports cough x3 days, states that he heard a "rumbling" in his chest while breathing this morning which caused concern.

## 2022-08-04 ENCOUNTER — Other Ambulatory Visit: Payer: Self-pay

## 2022-08-04 ENCOUNTER — Emergency Department (HOSPITAL_BASED_OUTPATIENT_CLINIC_OR_DEPARTMENT_OTHER)
Admission: EM | Admit: 2022-08-04 | Discharge: 2022-08-05 | Disposition: A | Discharge status: Home / Self Care | Payer: Self-pay | Attending: Emergency Medicine | Admitting: Emergency Medicine

## 2022-08-04 ENCOUNTER — Encounter (HOSPITAL_BASED_OUTPATIENT_CLINIC_OR_DEPARTMENT_OTHER): Payer: Self-pay

## 2022-08-04 DIAGNOSIS — Z7982 Long term (current) use of aspirin: Secondary | ICD-10-CM | POA: Insufficient documentation

## 2022-08-04 DIAGNOSIS — Z79899 Other long term (current) drug therapy: Secondary | ICD-10-CM | POA: Insufficient documentation

## 2022-08-04 DIAGNOSIS — I10 Essential (primary) hypertension: Secondary | ICD-10-CM | POA: Insufficient documentation

## 2022-08-04 DIAGNOSIS — E041 Nontoxic single thyroid nodule: Secondary | ICD-10-CM

## 2022-08-04 DIAGNOSIS — R221 Localized swelling, mass and lump, neck: Secondary | ICD-10-CM | POA: Insufficient documentation

## 2022-08-04 NOTE — ED Triage Notes (Signed)
Patient was lying in bed at home when he felt a nodule/lump on outer part of his neck. Pt denies any difficulty swallowing or trouble breathing. Writer can palpate nodule, no pain reported.

## 2022-08-05 ENCOUNTER — Emergency Department (HOSPITAL_BASED_OUTPATIENT_CLINIC_OR_DEPARTMENT_OTHER)
Admit: 2022-08-05 | Discharge: 2022-08-05 | Disposition: A | Discharge status: Home / Self Care | Payer: Self-pay | Attending: Emergency Medicine | Admitting: Emergency Medicine

## 2022-08-05 DIAGNOSIS — E041 Nontoxic single thyroid nodule: Secondary | ICD-10-CM

## 2022-08-05 NOTE — ED Provider Notes (Addendum)
[place] EMERGENCY DEPARTMENT AT [facility] Provider Note   CSN: [id number] Arrival date & time: 08/04/22  2238     History  Chief Complaint  Patient presents with   Mass    [name] is a 52 y.o. male.  Patient is a 52 year old male with history of hypertension.  Patient presenting today with complaints of a nodule in his neck.  He was sitting on the sofa this evening.  When he turned, he felt a small knot in his neck just above the sternum.  This is not painful and he is having no difficulty breathing or swallowing.  He has no other complaints.  The history is provided by the patient.       Home Medications Prior to Admission medications   Medication Sig Start Date End Date Taking? Authorizing Provider  amLODipine (NORVASC) 5 MG tablet Take 1 tablet (5 mg total) by mouth daily. 08/05/21 09/04/21  Charlott Rakes, MD  aspirin EC 81 MG tablet Take 81 mg by mouth daily.    [provider]  benzonatate (TESSALON) 100 MG capsule Take 1 capsule (100 mg total) by mouth every 8 (eight) hours. 05/31/22   Dorothyann Peng, PA-C  Cholecalciferol (VITAMIN D-3) 5000 UNIT/ML LIQD Vitamin D-3 5000  Vitamin D-3 5000 units  Take 1 capsule daily    [provider]  tadalafil (CIALIS) 20 MG tablet tadalafil 20 mg tablet  Take 1 tablet by oral route as directed.    [provider]      Allergies    Patient has no known allergies.    Review of Systems   Review of Systems  All other systems reviewed and are negative.   Physical Exam Updated Vital Signs BP (!) 160/95 (BP Location: Right Arm)   Pulse 61   Temp 97.7 F (36.5 C)   Resp 18   Ht '5\' 10"'$  (1.778 m)   Wt 93 kg   SpO2 100%   BMI 29.41 kg/m  Physical Exam Vitals and nursing note reviewed.  Constitutional:      Appearance: Normal appearance.  HENT:     Head: Normocephalic and atraumatic.  Neck:     Comments: In the anterior neck, just above the upper sternum, there is a  small, pea-sized palpable nodule.  It is nontender and there is no overlying erythema or warmth. Pulmonary:     Effort: Pulmonary effort is normal.  Skin:    General: Skin is warm and dry.  Neurological:     Mental Status: He is alert.     ED Results / Procedures / Treatments   Labs (all labs ordered are listed, but only abnormal results are displayed) Labs Reviewed - No data to display  EKG None  Radiology No results found.  Procedures Procedures    Medications Ordered in ED Medications - No data to display  ED Course/ Medical Decision Making/ A&P  Patient presenting with a knot/nodule to his anterior neck, the etiology of which I am uncertain.  I will arrange for patient to have an outpatient thyroid ultrasound to further evaluate.  Patient is to follow-up with his primary doctor.  Nothing today appears emergent.  Prior to discharge, I was informed that ultrasound was here late this evening so the study was obtained this evening.  Ultrasound shows a normal appearing thyroid, but does show area of hypoechogenicity with hypervascularity within the subcutaneous fat superficial to the sternal notch.  This finding is nonspecific, but likely represents an  inflammatory or infiltrative process.  This to be observed at home.  If not improving or resolving in the next several weeks, patient is to follow-up with his primary doctor to discuss the next steps.  Nothing appears emergent tonight that requires further imaging.  Final Clinical Impression(s) / ED Diagnoses Final diagnoses:  None    Rx / DC Orders ED Discharge Orders          Ordered    US THYROID        08/05/22 0029              Veryl Speak, MD 08/05/22 Heriberto Antigua    Veryl Speak, MD 08/05/22 0210

## 2022-08-05 NOTE — Discharge Instructions (Addendum)
Follow-up with primary doctor if not improving in the next 1 to 2 weeks.

## 2022-08-12 ENCOUNTER — Other Ambulatory Visit (HOSPITAL_BASED_OUTPATIENT_CLINIC_OR_DEPARTMENT_OTHER): Payer: Self-pay

## 2022-09-10 ENCOUNTER — Ambulatory Visit: Payer: Self-pay | Attending: Family Medicine | Admitting: Family Medicine

## 2023-09-04 IMAGING — CT CT ABD-PEL WO/W CM
5 of 12 series · 12 of 46 positions shown, 17 images · IV contrast (agent unspecified)
Comparison: CT the abdomen and pelvis 10/17/2011.

CLINICAL DATA: 50-year-old male with history of microscopic
hematuria.

EXAM:
CT ABDOMEN AND PELVIS WITHOUT AND WITH CONTRAST
TECHNIQUE: Multidetector CT imaging of the abdomen and pelvis was performed
following the standard protocol before and following the bolus
administration of intravenous contrast.

[Series 2: axial pre · axial · non-contrast · 0.87mm/px · z∈[-407,-257]mm · 2 of 90 slices shown]
[im 30/90  soft-tissue]
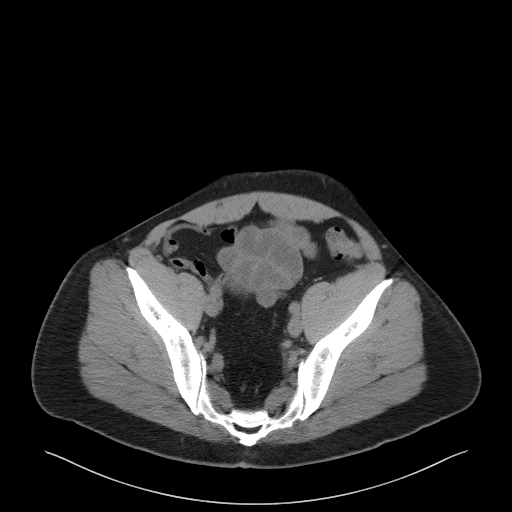
[im 60/90  soft-tissue]
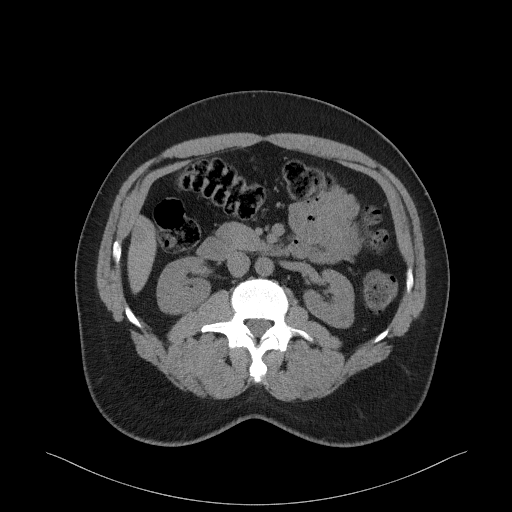

[Series 7: axial post · axial · 0.87mm/px · z∈[-407,-257]mm · 2 of 90 slices shown]
[im 30/90  soft-tissue]
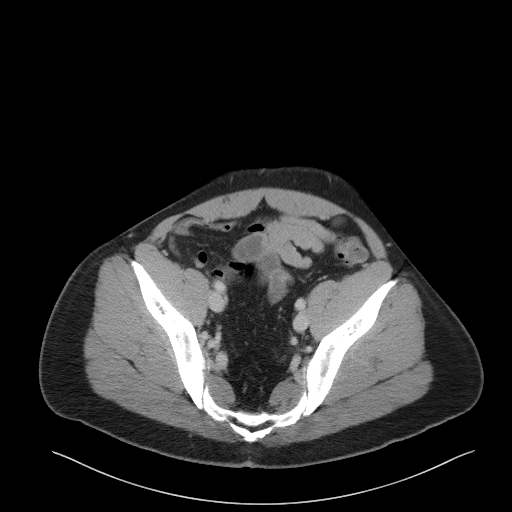
[im 60/90  soft-tissue]
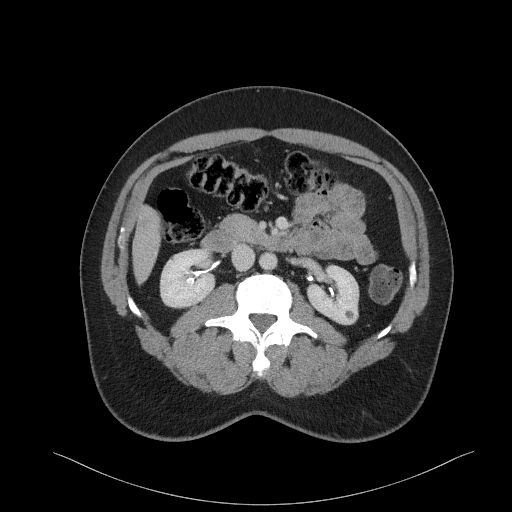

[Series 10: coronal post · coronal · 0.78mm/px · 2 of 95 slices shown, 3 images]
[im 32/95  soft-tissue]
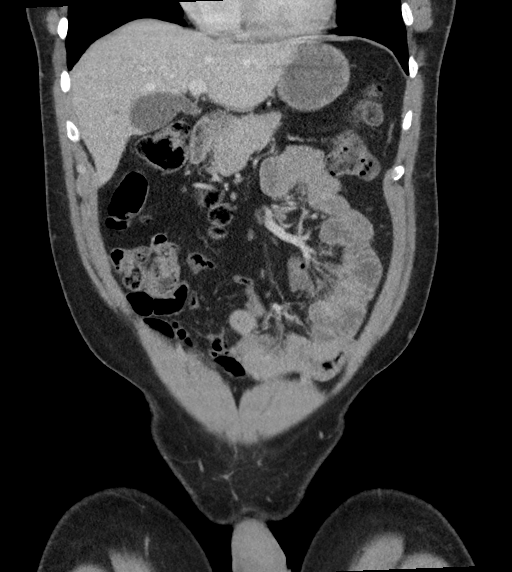
[im 32/95  bone]
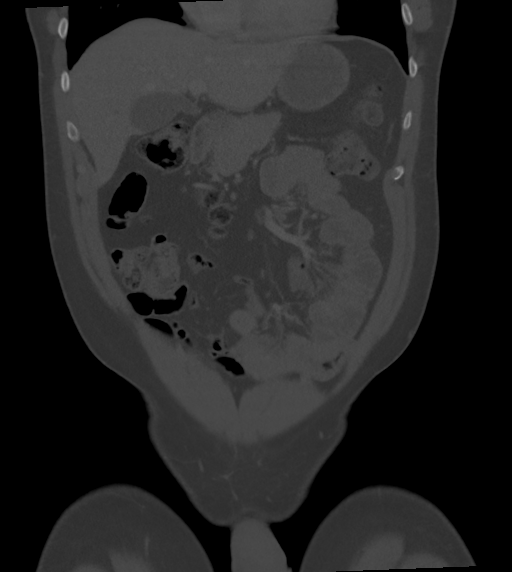
[im 63/95  soft-tissue]
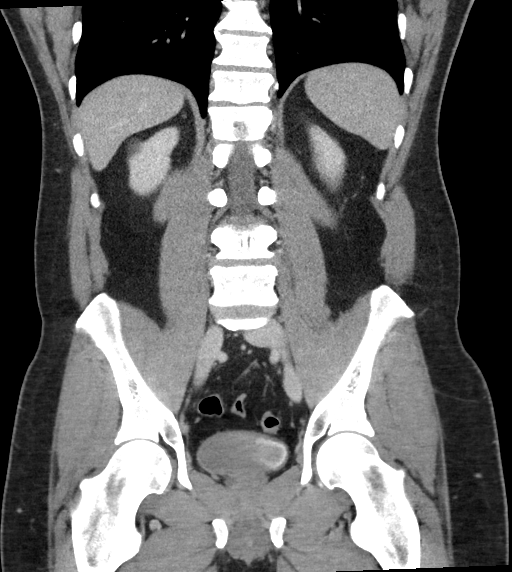

[Series 13: axial delay · axial · delayed · 0.80mm/px · z∈[-448,-208]mm · 3 of 97 slices shown, 7 images]
[im 25/97  soft-tissue]
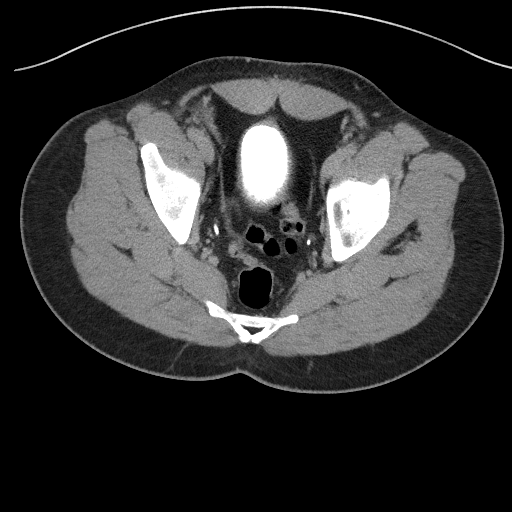
[im 25/97  lung]
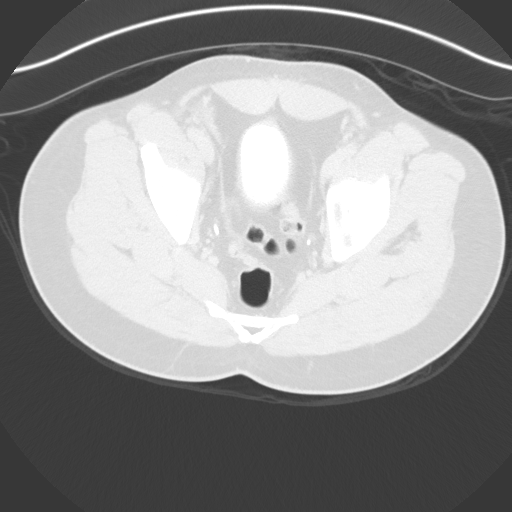
[im 25/97  bone]
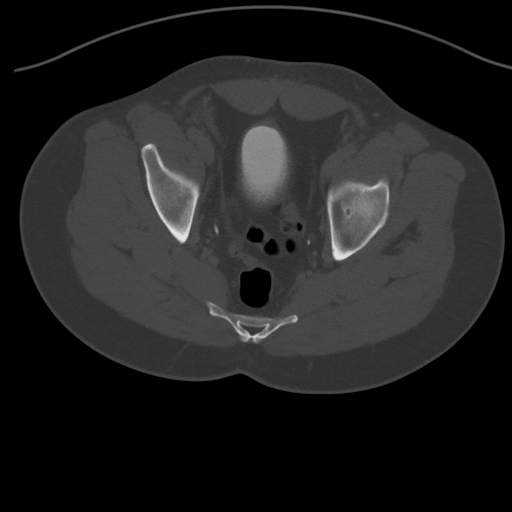
[im 49/97  soft-tissue]
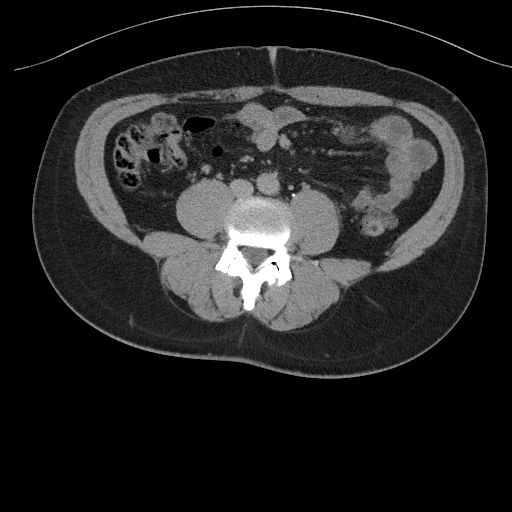
[im 49/97  lung]
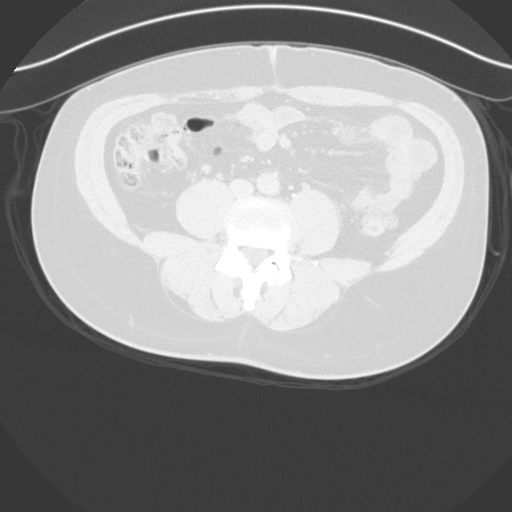
[im 73/97  soft-tissue]
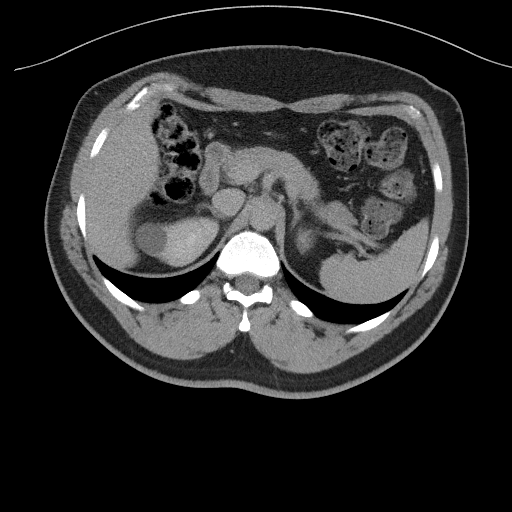
[im 73/97  lung]
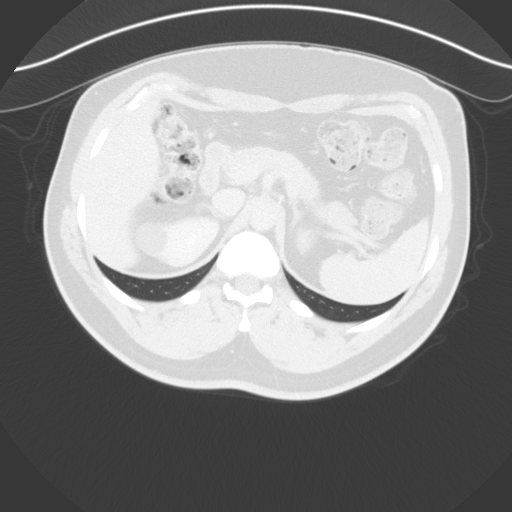

[Series 15: lung delay · axial · delayed · 0.80mm/px · z∈[-526,-394]mm · 3 of 241 slices shown]
[im 22/241  bone]
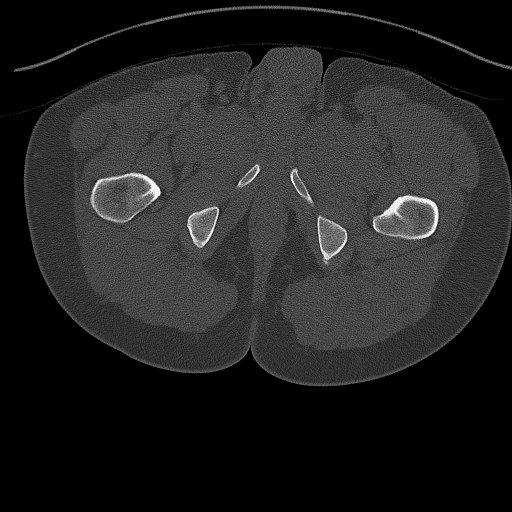
[im 44/241  bone]
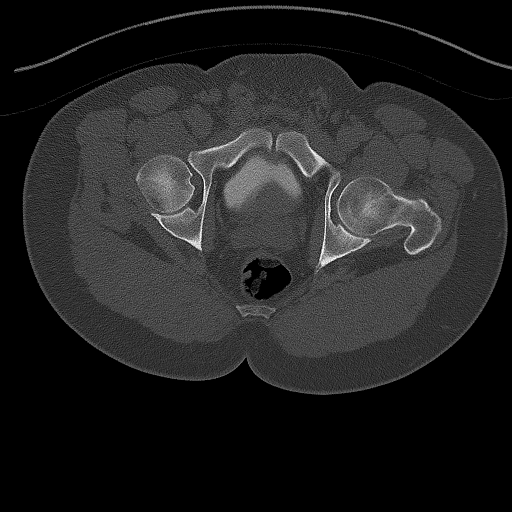
[im 88/241  bone]
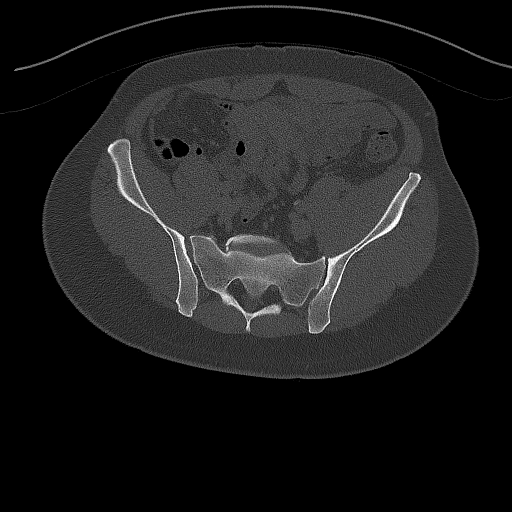

[12 of 46 positions shown; findings below may reference images not displayed]

RADIATION DOSE REDUCTION: This exam was performed according to the
departmental dose-optimization program which includes automated
exposure control, adjustment of the mA and/or kV according to
patient size and/or use of iterative reconstruction technique.

CONTRAST:  100mL OMNIPAQUE IOHEXOL 300 MG/ML  SOLN
FINDINGS: Lower chest: Unremarkable.

Hepatobiliary: A few subcentimeter low-attenuation lesions are noted
in the liver, too small to characterize, but statistically likely to
represent tiny cysts and/or biliary hamartomas. No other larger more
suspicious cystic or solid hepatic lesions. No intra or extrahepatic
biliary ductal dilatation. Gallbladder is unremarkable in
appearance.

Pancreas: No pancreatic mass. No pancreatic ductal dilatation. No
pancreatic or peripancreatic fluid collections or inflammatory
changes.

Spleen: Unremarkable.

Adrenals/Urinary Tract: No calcifications are identified within the
collecting system of either kidney, along the course of either
ureter, or within the lumen of the urinary bladder. No
hydroureteronephrosis. Low-attenuation nonenhancing lesions in the
right kidney, compatible with simple cysts (Bosniak class 1)
measuring up to 2.6 cm in the upper pole. Subcentimeter
low-attenuation lesions in the upper pole and lateral aspect of the
interpolar region of the left kidney, too small to definitively
characterize, but statistically likely to represent a tiny cyst,
although new compared to prior study from 5740. No solid appearing
renal lesions are noted. Postcontrast delayed images demonstrate no
definite filling defect within the collecting system of either
kidney, along the well opacified portions of either ureter (proximal
and middle third of the right ureter are incompletely opacified on
delayed imaging), or within the lumen of the urinary bladder to
strongly suggest the presence of urothelial neoplasm at this time.
Urinary bladder is normal in appearance.

Stomach/Bowel: The appearance of the stomach is normal. No
pathologic dilatation of small bowel or colon. Normal appendix.

Vascular/Lymphatic: No significant atherosclerotic disease, aneurysm
or dissection noted in the abdominal or pelvic vasculature. No
lymphadenopathy noted in the abdomen or pelvis.

Reproductive: Prostate gland and seminal vesicles are unremarkable
in appearance.

Other: No significant volume of ascites.  No pneumoperitoneum.

Musculoskeletal: There are no aggressive appearing lytic or blastic
lesions noted in the visualized portions of the skeleton.
IMPRESSION: 1. No definite source for hematuria identified on today's
examination. There are Bosniak class 1 cysts in the right kidney
(benign) and subcentimeter low-attenuation lesions in the left
kidney. These subcentimeter lesions are new compared to the prior
study from 5740, and too small to definitively characterize, but
favored to represent tiny cysts. Should the patient's hematuria
persist or worsen, further characterization with abdominal MRI with
and without IV gadolinium could be considered.
2. Additional incidental findings, as above.

## 2023-09-19 ENCOUNTER — Other Ambulatory Visit: Payer: Self-pay

## 2023-09-19 ENCOUNTER — Emergency Department (HOSPITAL_BASED_OUTPATIENT_CLINIC_OR_DEPARTMENT_OTHER): Payer: Self-pay

## 2023-09-19 ENCOUNTER — Emergency Department (HOSPITAL_BASED_OUTPATIENT_CLINIC_OR_DEPARTMENT_OTHER)
Admission: EM | Admit: 2023-09-19 | Discharge: 2023-09-19 | Disposition: A | Discharge status: Home / Self Care | Payer: Self-pay | Attending: Emergency Medicine | Admitting: Emergency Medicine

## 2023-09-19 ENCOUNTER — Encounter (HOSPITAL_BASED_OUTPATIENT_CLINIC_OR_DEPARTMENT_OTHER): Payer: Self-pay

## 2023-09-19 DIAGNOSIS — R051 Acute cough: Secondary | ICD-10-CM | POA: Insufficient documentation

## 2023-09-19 DIAGNOSIS — Z7982 Long term (current) use of aspirin: Secondary | ICD-10-CM | POA: Insufficient documentation

## 2023-09-19 MED ORDER — BENZONATATE 100 MG PO CAPS: 100.0000 mg | ORAL_CAPSULE | Freq: Three times a day (TID) | ORAL | 0 refills | Status: AC

## 2023-09-19 MED ORDER — LORATADINE 10 MG PO TABS: 10.0000 mg | ORAL_TABLET | Freq: Every day | ORAL | 0 refills | Status: DC

## 2023-09-19 NOTE — Discharge Instructions (Signed)
 Please read and follow all provided instructions.  Your diagnoses today include:  1. Acute cough     Tests performed today include: Chest x-ray - does not show any pneumonia or other problems Vital signs. See below for your results today.   Medications prescribed:  Tessalon Perles - cough suppressant medication  Loratadine - allergy medication  Take any prescribed medications only as directed.  Home care instructions:  Follow any educational materials contained in this packet.  Follow-up instructions: Please follow-up with your primary care provider in the next 5-7 days for further evaluation of your symptoms and a recheck if you are not feeling better.   Return instructions:  Please return to the Emergency Department if you experience worsening symptoms. Please return with worsening wheezing, shortness of breath, or difficulty breathing. Return with persistent fever above 101F.  Please return if you have any other emergent concerns.  Additional Information:  Your vital signs today were: BP (!) 148/86 (BP Location: Right Arm)   Pulse (!) 55   Temp 98.8 F (37.1 C) (Oral)   Resp 18   Ht 5\' 10"  (1.778 m)   Wt 93 kg   SpO2 95%   BMI 29.41 kg/m  If your blood pressure (BP) was elevated above 135/85 this visit, please have this repeated by your doctor within one month. --------------

## 2023-09-19 NOTE — ED Triage Notes (Signed)
 C/o productive cough for over a week. States he has had a couple of episodes where he wakes up gasping for air.

## 2023-09-19 NOTE — ED Provider Notes (Signed)
 [facility] Provider Note   CSN: [id number] Arrival date & time: 09/19/23  1045     History  Chief Complaint  Patient presents with   Cough    [name] is a 53 y.o. male.  Patient with history of seasonal allergies presents to the emergency department today for evaluation of cough.  Patient reports productive cough for 1 week.  No associated fevers, ear pain, sore throat.  No nausea or vomiting.  He states that he has woke up a couple of times gasping so he has been propping himself up more at night.  No history of heart failure or lower extremity swelling.  He does report nasal congestion from allergies.  He has tried Mucinex with some improvement.  States that he came in today just to get checked.         Home Medications Prior to Admission medications   Medication Sig Start Date End Date Taking? Authorizing Provider  amLODipine (NORVASC) 5 MG tablet Take 1 tablet (5 mg total) by mouth daily. 08/05/21 09/04/21  Newlin, Enobong, MD  aspirin EC 81 MG tablet Take 81 mg by mouth daily.    [provider]  benzonatate (TESSALON) 100 MG capsule Take 1 capsule (100 mg total) by mouth every 8 (eight) hours. 05/31/22   Elisa Guest, PA-C  Cholecalciferol (VITAMIN D-3) 5000 UNIT/ML LIQD Vitamin D-3 5000  Vitamin D-3 5000 units  Take 1 capsule daily    [provider]  tadalafil (CIALIS) 20 MG tablet tadalafil 20 mg tablet  Take 1 tablet by oral route as directed.    [provider]      Allergies    Patient has no known allergies.    Review of Systems   Review of Systems  Physical Exam Updated Vital Signs BP (!) 148/86 (BP Location: Right Arm)   Pulse (!) 55   Temp 98.8 F (37.1 C) (Oral)   Resp 18   Ht 5\' 10"  (1.778 m)   Wt 93 kg   SpO2 95%   BMI 29.41 kg/m  Physical Exam Vitals and nursing note reviewed.  Constitutional:      Appearance: He is well-developed.  HENT:     Head:  Normocephalic and atraumatic.     Jaw: No trismus.     Right Ear: Tympanic membrane, ear canal and external ear normal.     Left Ear: Tympanic membrane, ear canal and external ear normal.     Nose: Congestion present. No mucosal edema or rhinorrhea.     Mouth/Throat:     Mouth: Mucous membranes are not dry.     Pharynx: Uvula midline. No oropharyngeal exudate, posterior oropharyngeal erythema or uvula swelling.     Tonsils: No tonsillar abscesses.  Eyes:     General:        Right eye: No discharge.        Left eye: No discharge.     Conjunctiva/sclera: Conjunctivae normal.  Cardiovascular:     Rate and Rhythm: Normal rate and regular rhythm.     Heart sounds: Normal heart sounds.  Pulmonary:     Effort: Pulmonary effort is normal. No respiratory distress.     Breath sounds: Normal breath sounds. No wheezing or rales.     Comments: Lungs clear to auscultation bilaterally. Abdominal:     Palpations: Abdomen is soft.     Tenderness: There is no abdominal tenderness.  Musculoskeletal:     Cervical back: Normal  range of motion and neck supple.     Right lower leg: No edema.     Left lower leg: No edema.  Skin:    General: Skin is warm and dry.  Neurological:     Mental Status: He is alert.     ED Results / Procedures / Treatments   Labs (all labs ordered are listed, but only abnormal results are displayed) Labs Reviewed - No data to display  EKG None  Radiology No results found.  Procedures Procedures    Medications Ordered in ED Medications - No data to display  ED Course/ Medical Decision Making/ A&P    Patient seen and examined. History obtained directly from patient.   Labs/EKG: None ordered  Imaging: Ordered chest x-ray.  Medications/Fluids: None ordered  Most recent vital signs reviewed and are as follows: BP (!) 148/86 (BP Location: Right Arm)   Pulse (!) 55   Temp 98.8 F (37.1 C) (Oral)   Resp 18   Ht 5\' 10"  (1.778 m)   Wt 93 kg   SpO2 95%    BMI 29.41 kg/m   Initial impression: Well-appearing patient with cough, lungs are clear, no signs of CHF on exam.  Will obtain chest x-ray to evaluate lung parenchyma.  11:55 AM Reassessment performed. Patient appears comfortable.  Imaging personally visualized and interpreted including: Chest x-ray, agree negative.  Reviewed pertinent lab work and imaging with patient at bedside. Questions answered.   Most current vital signs reviewed and are as follows: BP (!) 148/86 (BP Location: Right Arm)   Pulse (!) 55   Temp 98.8 F (37.1 C) (Oral)   Resp 18   Ht 5\' 10"  (1.778 m)   Wt 93 kg   SpO2 95%   BMI 29.41 kg/m   Plan: Discharge to home.   Prescriptions written for: Tessalon, loratadine  Other home care instructions discussed: Trial omeprazole if desired  ED return instructions discussed: Worsening shortness of breath, trouble breathing, fever  Follow-up instructions discussed: Patient encouraged to follow-up with their PCP in 5-7 days if not improving.                                Medical Decision Making Amount and/or Complexity of Data Reviewed Radiology: ordered.   Well-appearing patient with cough.  Productive over the past 1 week.  No fevers.  Vital signs are reassuring.  Chest x-ray is clear.  No signs of pneumonia.  Cannot rule out bronchitis, not likely to be bacterial.  Do not feel that antibiotic trial indicated at this time.  Also consider GERD, postnasal drip.  Will treat symptomatically and have patient follow-up if needed.  The patient's vital signs, pertinent lab work and imaging were reviewed and interpreted as discussed in the ED course. Hospitalization was considered for further testing, treatments, or serial exams/observation. However as patient is well-appearing, has a stable exam, and reassuring studies today, I do not feel that they warrant admission at this time. This plan was discussed with the patient who verbalizes agreement and comfort with this  plan and seems reliable and able to return to the Emergency Department with worsening or changing symptoms.         Final Clinical Impression(s) / ED Diagnoses Final diagnoses:  Acute cough    Rx / DC Orders ED Discharge Orders          Ordered    loratadine (CLARITIN) 10 MG tablet  Daily        09/19/23 1150    benzonatate (TESSALON) 100 MG capsule  Every 8 hours        09/19/23 1150              Alaria Oconnor, PA-C 09/19/23 1156    Lowery Rue, DO 09/19/23 1229

## 2024-02-22 ENCOUNTER — Ambulatory Visit: Payer: Self-pay | Attending: Family Medicine | Admitting: Family Medicine

## 2024-02-22 ENCOUNTER — Encounter: Payer: Self-pay | Admitting: Family Medicine

## 2024-02-22 VITALS — BP 127/80 | HR 47 | Temp 98.7°F | Ht 70.0 in | Wt 200.8 lb

## 2024-02-22 DIAGNOSIS — Z125 Encounter for screening for malignant neoplasm of prostate: Secondary | ICD-10-CM

## 2024-02-22 DIAGNOSIS — Z7982 Long term (current) use of aspirin: Secondary | ICD-10-CM

## 2024-02-22 DIAGNOSIS — Z0001 Encounter for general adult medical examination with abnormal findings: Secondary | ICD-10-CM

## 2024-02-22 DIAGNOSIS — Z131 Encounter for screening for diabetes mellitus: Secondary | ICD-10-CM

## 2024-02-22 DIAGNOSIS — R051 Acute cough: Secondary | ICD-10-CM

## 2024-02-22 DIAGNOSIS — R001 Bradycardia, unspecified: Secondary | ICD-10-CM

## 2024-02-22 DIAGNOSIS — Z1211 Encounter for screening for malignant neoplasm of colon: Secondary | ICD-10-CM

## 2024-02-22 DIAGNOSIS — I10 Essential (primary) hypertension: Secondary | ICD-10-CM

## 2024-02-22 MED ORDER — AMLODIPINE BESYLATE 2.5 MG PO TABS: 2.5000 mg | ORAL_TABLET | Freq: Every day | ORAL | 1 refills | Status: AC

## 2024-02-22 MED ORDER — CETIRIZINE HCL 10 MG PO TABS: 10.0000 mg | ORAL_TABLET | Freq: Every day | ORAL | 1 refills | Status: DC

## 2024-02-22 NOTE — Patient Instructions (Signed)
 VISIT SUMMARY:  You came in today for your annual physical exam. We discussed your blood pressure, recent cough, episodes of waking up gasping for air, vision changes, and heart rate. We also reviewed general health maintenance, including cancer screenings and routine lab tests.  YOUR PLAN:  -ACUTE COUGH WITH POSTNASAL DRIP AND POSSIBLE REFLUX: Your cough is likely due to allergies or exposure to dust at work, and it may also be related to reflux or postnasal drip causing you to wake up gasping for air. I have prescribed Zyrtec  to help with the allergies. Additionally, I recommend making some dietary changes to help prevent reflux.  -ESSENTIAL HYPERTENSION: Your blood pressure has been stable, but there were some elevated readings in April. Hypertension means high blood pressure, which can lead to serious health issues if not managed. I have prescribed amlodipine  2.5 mg to help control your blood pressure. Please continue to monitor your blood pressure at home and we will adjust the medication as needed.  -BRADYCARDIA: Bradycardia means having a slower than normal heart rate. Your previous EKG showed sinus bradycardia. We will perform another EKG today, and if your heart rate remains low, I will refer you to a cardiologist for further evaluation.  -ADULT WELLNESS VISIT: We discussed the importance of screenings for prostate and colon cancer. Since your insurance does not cover a colonoscopy, I have ordered a stool test for colon cancer screening. Additionally, I have ordered a comprehensive metabolic panel and a diabetes screening to check your overall health.  INSTRUCTIONS:  Please follow up with the eye doctor regarding the flashes of light and fogginess in your peripheral vision. Continue to monitor your blood pressure at home and take the prescribed amlodipine  2.5 mg. Make the recommended dietary changes to help with reflux. We will perform an EKG today, and based on the results, you may need to  see a cardiologist. Complete the stool test for colon cancer screening and the lab tests for the comprehensive metabolic panel and diabetes screening as ordered.

## 2024-02-22 NOTE — Progress Notes (Signed)
 Subjective:  Patient ID: [name], male    DOB: 10/25/1970  Age: 53 y.o. MRN: [id number]  CC: Annual Exam (Check PSA levels/ lab work/Cough for 1 week.)     Discussed the use of AI scribe software for clinical note transcription with the patient, who gave verbal consent to proceed.  History of Present Illness [name] is a 53 year old male who presents for an annual physical exam.  He has not been taking amlodipine  for hypertension but monitors his blood pressure at home. Recent readings were normal, with occasional elevations in April at 148/86 mmHg and 144/94 mmHg.  He has had a cough for a week, which he thinks possibly due to allergies or dust exposure at work. He uses golden seal, echinacea drops, and Mucinex, which have reduced the cough's severity. There is no shortness of breath or wheezing.  He experiences episodes of waking up gasping for air, linked to chest congestion and possible reflux. He avoids lying flat and uses pillows to sleep. Similar episodes occurred last year, sometimes during naps. He frequently clears his throat due to mucus.  He notices flashes of light in his right eye and fogginess in peripheral vision. He plans to see an eye doctor as his last visit was 1 year ago.  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   Carotid artery occlusion    Glaucoma    Hyperlipidemia    Hypertension     Past Surgical History:  Procedure Laterality Date   CARPAL TUNNEL RELEASE     carpel tunnel     carpel tunnel surgery Right     Family History  Problem Relation Age of Onset   Breast cancer Mother     Social History   Socioeconomic History   Marital status: Divorced    Spouse name: Not on file   Number of children: 0   Years of education: Not on file   Highest education level: Not on file  Occupational  History   Not on file  Tobacco Use   Smoking status: Never   Smokeless tobacco: Never  Vaping Use   Vaping status: Never Used  Substance and Sexual Activity   Alcohol use: Yes    Comment: occ   Drug use: No   Sexual activity: Not on file  Other Topics Concern   Not on file  Social History Narrative   [employer].    Professional musician- Estate manager/land agent.   Social Drivers of Health   Financial Resource Strain: Medium Risk (08/14/2021)   Overall Financial Resource Strain (CARDIA)    Difficulty of Paying Living Expenses: Somewhat hard  Food Insecurity: No Food Insecurity (08/14/2021)   Hunger Vital Sign    Worried About Running Out of Food in the Last Year: Never true    Ran Out of Food in the Last Year: Never true  Transportation Needs: No Transportation Needs (08/14/2021)   PRAPARE - Administrator, Civil Service (Medical): No    Lack of Transportation (Non-Medical): No  Physical Activity: Not on file  Stress: Not on file  Social Connections: Unknown (10/19/2021)   [facility]   Social Network    Social Network: Not on file    No Known Allergies  Outpatient Medications Prior to Visit  Medication Sig Dispense Refill   aspirin   EC 81 MG tablet Take 81 mg by mouth daily.     amLODipine  (NORVASC ) 5 MG tablet Take 1 tablet (5 mg total) by mouth daily. 30 tablet 6   benzonatate  (TESSALON ) 100 MG capsule Take 1 capsule (100 mg total) by mouth every 8 (eight) hours. (Patient not taking: Reported on 02/22/2024) 15 capsule 0   Cholecalciferol (VITAMIN D-3) 5000 UNIT/ML LIQD Vitamin D-3 5000  Vitamin D-3 5000 units  Take 1 capsule daily (Patient not taking: Reported on 02/22/2024)     tadalafil (CIALIS) 20 MG tablet tadalafil 20 mg tablet  Take 1 tablet by oral route as directed. (Patient not taking: Reported on 02/22/2024)     loratadine  (CLARITIN ) 10 MG tablet Take 1 tablet (10 mg total) by mouth daily. (Patient not taking: Reported on 02/22/2024) 30 tablet 0   No  facility-administered medications prior to visit.     ROS Review of Systems  Constitutional:  Negative for activity change and appetite change.  HENT:  Negative for sinus pressure and sore throat.   Respiratory:  Positive for cough. Negative for chest tightness, shortness of breath and wheezing.   Cardiovascular:  Negative for chest pain and palpitations.  Gastrointestinal:  Negative for abdominal distention, abdominal pain and constipation.  Genitourinary: Negative.   Musculoskeletal: Negative.   Psychiatric/Behavioral:  Negative for behavioral problems and dysphoric mood.     Objective:  BP 127/80   Pulse (!) 47   Temp 98.7 F (37.1 C) (Oral)   Ht 5' 10 (1.778 m)   Wt 200 lb 12.8 oz (91.1 kg)   SpO2 100%   BMI 28.81 kg/m      02/22/2024    8:50 AM 09/19/2023   10:51 AM 09/19/2023   10:50 AM  BP/Weight  Systolic BP 127 148   Diastolic BP 80 86   Wt. (Lbs) 200.8  205  BMI 28.81 kg/m2  29.41 kg/m2      Physical Exam Constitutional:      Appearance: He is well-developed.  HENT:     Head: Normocephalic and atraumatic.     Right Ear: External ear normal.     Left Ear: External ear normal.  Eyes:     Conjunctiva/sclera: Conjunctivae normal.     Pupils: Pupils are equal, round, and reactive to light.  Neck:     Trachea: No tracheal deviation.  Cardiovascular:     Rate and Rhythm: Regular rhythm. Bradycardia present.     Heart sounds: Normal heart sounds. No murmur heard. Pulmonary:     Effort: Pulmonary effort is normal. No respiratory distress.     Breath sounds: Normal breath sounds. No wheezing.  Chest:     Chest wall: No tenderness.  Abdominal:     General: Bowel sounds are normal.     Palpations: Abdomen is soft. There is no mass.     Tenderness: There is no abdominal tenderness.  Musculoskeletal:        General: No tenderness. Normal range of motion.     Cervical back: Normal range of motion and neck supple.  Skin:    General: Skin is warm and dry.   Neurological:     Mental Status: He is alert and oriented to person, place, and time.        Latest Ref Rng & Units 07/27/2021   11:58 PM 01/30/2021    4:05 PM 09/01/2019    9:30 AM  CMP  Glucose 70 - 99 mg/dL 899  78  76   BUN  6 - 20 mg/dL 12  11  13    Creatinine 0.61 - 1.24 mg/dL 8.60  8.70  8.69   Sodium 135 - 145 mmol/L 134  140  138   Potassium 3.5 - 5.1 mmol/L 3.5  4.7  3.7   Chloride 98 - 111 mmol/L 103  101  103   CO2 22 - 32 mmol/L 25  27  23    Calcium 8.9 - 10.3 mg/dL 8.7  9.7  9.4   Total Protein 6.0 - 8.5 g/dL  7.2  7.1   Total Bilirubin 0.0 - 1.2 mg/dL  0.4  0.3   Alkaline Phos 44 - 121 IU/L  80  82   AST 0 - 40 IU/L  22  23   ALT 0 - 44 IU/L  13  11     Lipid Panel     Component Value Date/Time   CHOL 163 09/01/2019 0930   TRIG 31 09/01/2019 0930   HDL 68 09/01/2019 0930   CHOLHDL 2.4 09/01/2019 0930   CHOLHDL 2.4 09/08/2007 0950   VLDL 5 09/08/2007 0950   LDLCALC 88 09/01/2019 0930    CBC    Component Value Date/Time   WBC 5.2 07/27/2021 2358   RBC 4.39 07/27/2021 2358   HGB 13.4 07/27/2021 2358   HCT 39.5 07/27/2021 2358   PLT 271 07/27/2021 2358   MCV 90.0 07/27/2021 2358   MCH 30.5 07/27/2021 2358   MCHC 33.9 07/27/2021 2358   RDW 12.2 07/27/2021 2358   LYMPHSABS 2.7 07/27/2021 2358   MONOABS 0.5 07/27/2021 2358   EOSABS 0.2 07/27/2021 2358   BASOSABS 0.0 07/27/2021 2358    Lab Results  Component Value Date   HGBA1C  09/08/2007    5.6 (NOTE)   The ADA recommends the following therapeutic goals for glycemic   control related to Hgb A1C measurement:   Goal of Therapy:   < 7.0% Hgb A1C   Action Suggested:  > 8.0% Hgb A1C   Ref:  Diabetes Care, 22, Suppl. 1, 1999       Assessment & Plan Adult visit for general medical exam with abnormal findings Discussed importance of prostate and colon cancer screenings. Recommended stool test due to lack of coverage. Discussed metabolic panel and diabetes screening. - Order stool test for colon  cancer screening. - Order comprehensive metabolic panel. - Order diabetes screening.  Acute cough with post nasal drip Cough likely due to allergies or occupational exposure. Possible reflux or postnasal drip causing nocturnal gasping. - Prescribe Zyrtec . - Advise dietary modifications to prevent reflux.  Essential hypertension Blood pressure well-controlled without medication. Previous readings elevated. -  Decrease amlodipine  from 5mg  to 2.5 mg as BP today is at goal despite not taking antihypertensive. - Monitor blood pressure at home and adjust medication as needed. -If soft BP consider discontinue Amlodipine   Bradycardia Consistent low heart rate. Previous EKG showed sinus bradycardia. - Perform EKG. - Refer to cardiologist          Meds ordered this encounter  Medications   amLODipine  (NORVASC ) 2.5 MG tablet    Sig: Take 1 tablet (2.5 mg total) by mouth daily.    Dispense:  90 tablet    Refill:  1   cetirizine  (ZYRTEC ) 10 MG tablet    Sig: Take 1 tablet (10 mg total) by mouth daily.    Dispense:  30 tablet    Refill:  1    Follow-up: Return in about 6 months (around 08/21/2024) for Chronic  meidical conditions.       Corrina Sabin, MD, FAAFP. [facility] [place], [place] [phone]   02/22/2024, 12:12 PM

## 2024-02-23 ENCOUNTER — Ambulatory Visit: Payer: Self-pay | Admitting: Family Medicine

## 2024-02-23 DIAGNOSIS — E875 Hyperkalemia: Secondary | ICD-10-CM

## 2024-02-23 LAB — CBC WITH DIFFERENTIAL/PLATELET
Basophils Absolute: 0.1 x10E3/uL (ref 0.0–0.2)
Basos: 1 %
EOS (ABSOLUTE): 0.2 x10E3/uL (ref 0.0–0.4)
Eos: 4 %
Hematocrit: 43.1 % (ref 37.5–51.0)
Hemoglobin: 14.3 g/dL (ref 13.0–17.7)
Immature Grans (Abs): 0 x10E3/uL (ref 0.0–0.1)
Immature Granulocytes: 0 %
Lymphocytes Absolute: 1.9 x10E3/uL (ref 0.7–3.1)
Lymphs: 52 %
MCH: 30.6 pg (ref 26.6–33.0)
MCHC: 33.2 g/dL (ref 31.5–35.7)
MCV: 92 fL (ref 79–97)
Monocytes Absolute: 0.4 x10E3/uL (ref 0.1–0.9)
Monocytes: 11 %
Neutrophils Absolute: 1.2 x10E3/uL — ABNORMAL LOW (ref 1.4–7.0)
Neutrophils: 32 %
Platelets: 282 x10E3/uL (ref 150–450)
RBC: 4.68 x10E6/uL (ref 4.14–5.80)
RDW: 12.5 % (ref 11.6–15.4)
WBC: 3.6 x10E3/uL (ref 3.4–10.8)

## 2024-02-23 LAB — CMP14+EGFR
ALT: 14 IU/L (ref 0–44)
AST: 23 IU/L (ref 0–40)
Albumin: 4.5 g/dL (ref 3.8–4.9)
Alkaline Phosphatase: 78 IU/L (ref 49–135)
BUN/Creatinine Ratio: 10 (ref 9–20)
BUN: 15 mg/dL (ref 6–24)
Bilirubin Total: 0.4 mg/dL (ref 0.0–1.2)
CO2: 22 mmol/L (ref 20–29)
Calcium: 9.6 mg/dL (ref 8.7–10.2)
Chloride: 103 mmol/L (ref 96–106)
Creatinine, Ser: 1.44 mg/dL — ABNORMAL HIGH (ref 0.76–1.27)
Globulin, Total: 2.8 g/dL (ref 1.5–4.5)
Glucose: 90 mg/dL (ref 70–99)
Potassium: 5.5 mmol/L — ABNORMAL HIGH (ref 3.5–5.2)
Sodium: 139 mmol/L (ref 134–144)
Total Protein: 7.3 g/dL (ref 6.0–8.5)
eGFR: 58 mL/min/1.73 — ABNORMAL LOW (ref >59)

## 2024-02-23 LAB — LP+NON-HDL CHOLESTEROL
Cholesterol, Total: 176 mg/dL (ref 100–199)
HDL: 60 mg/dL (ref >39)
LDL Chol Calc (NIH): 105 mg/dL — ABNORMAL HIGH (ref 0–99)
Total Non-HDL-Chol (LDL+VLDL): 116 mg/dL (ref 0–129)
Triglycerides: 55 mg/dL (ref 0–149)
VLDL Cholesterol Cal: 11 mg/dL (ref 5–40)

## 2024-02-23 LAB — PSA, TOTAL AND FREE
PSA, Free Pct: 33.3 %
PSA, Free: 0.2 ng/mL
Prostate Specific Ag, Serum: 0.6 ng/mL (ref 0.0–4.0)

## 2024-02-23 LAB — HEMOGLOBIN A1C
Est. average glucose Bld gHb Est-mCnc: 120 mg/dL
Hgb A1c MFr Bld: 5.8 % — ABNORMAL HIGH (ref 4.8–5.6)

## 2024-03-08 ENCOUNTER — Encounter: Payer: Self-pay | Admitting: Family Medicine

## 2024-03-19 ENCOUNTER — Other Ambulatory Visit: Payer: Self-pay | Admitting: Family Medicine

## 2024-06-15 ENCOUNTER — Encounter: Payer: Self-pay | Admitting: Family Medicine

## 2024-06-16 ENCOUNTER — Other Ambulatory Visit: Payer: Self-pay | Admitting: Family Medicine

## 2024-06-16 DIAGNOSIS — G08 Intracranial and intraspinal phlebitis and thrombophlebitis: Secondary | ICD-10-CM

## 2024-06-16 NOTE — Progress Notes (Signed)
 I had advised him to drop off a urine specimen for POC urinalysis to evaluate for presence of hematuria.  Diagnosis will be history of hematuria.  Thanks.

## 2024-06-16 NOTE — Progress Notes (Signed)
 Noted

## 2024-06-21 ENCOUNTER — Telehealth: Payer: Self-pay

## 2024-06-21 ENCOUNTER — Ambulatory Visit: Payer: Self-pay | Attending: Family Medicine

## 2024-06-21 ENCOUNTER — Inpatient Hospital Stay
Admission: RE | Admit: 2024-06-21 | Discharge: 2024-06-21 | Payer: Self-pay | Attending: Family Medicine | Admitting: Family Medicine

## 2024-06-21 ENCOUNTER — Ambulatory Visit: Payer: Self-pay | Admitting: Family Medicine

## 2024-06-21 DIAGNOSIS — R319 Hematuria, unspecified: Secondary | ICD-10-CM

## 2024-06-21 DIAGNOSIS — G08 Intracranial and intraspinal phlebitis and thrombophlebitis: Secondary | ICD-10-CM

## 2024-06-21 LAB — POCT URINALYSIS DIP (CLINITEK)
Bilirubin, UA: NEGATIVE
Glucose, UA: NEGATIVE mg/dL
Ketones, POC UA: NEGATIVE mg/dL
Leukocytes, UA: NEGATIVE
Nitrite, UA: NEGATIVE
POC PROTEIN,UA: NEGATIVE
Spec Grav, UA: 1.015
Urobilinogen, UA: 0.2 U/dL
pH, UA: 6.5

## 2024-06-21 NOTE — Telephone Encounter (Signed)
 POC UA results in the chart.

## 2024-06-21 NOTE — Progress Notes (Signed)
 Patient in for POC UA

## 2024-06-21 NOTE — Telephone Encounter (Signed)
 Addressed.

## 2024-06-27 ENCOUNTER — Ambulatory Visit: Payer: Self-pay | Admitting: Family Medicine

## 2024-06-27 DIAGNOSIS — G08 Intracranial and intraspinal phlebitis and thrombophlebitis: Secondary | ICD-10-CM

## 2024-06-30 ENCOUNTER — Ambulatory Visit
Admission: RE | Admit: 2024-06-30 | Discharge: 2024-06-30 | Disposition: A | Discharge status: Home / Self Care | Payer: Self-pay | Source: Ambulatory Visit | Attending: Family Medicine | Admitting: Family Medicine

## 2024-06-30 DIAGNOSIS — R319 Hematuria, unspecified: Secondary | ICD-10-CM

## 2024-07-04 ENCOUNTER — Ambulatory Visit: Payer: Self-pay | Admitting: Family Medicine

## 2024-07-04 DIAGNOSIS — R319 Hematuria, unspecified: Secondary | ICD-10-CM

## 2024-07-26 ENCOUNTER — Other Ambulatory Visit: Payer: Self-pay

## 2024-08-02 ENCOUNTER — Ambulatory Visit: Payer: Self-pay | Admitting: Family Medicine

## 2024-08-09 ENCOUNTER — Ambulatory Visit: Payer: Self-pay | Admitting: Urology

## 2024-08-22 ENCOUNTER — Ambulatory Visit: Payer: Self-pay | Admitting: Family Medicine
